# Patient Record
Sex: Female | Born: 1990 | Race: White | Hispanic: No | Marital: Single | Smoking: Former smoker
Health system: Southern US, Community
[De-identification: ages and names within clinical notes are randomized; demographics above are authoritative.]

## PROBLEM LIST (undated history)

## (undated) ENCOUNTER — Ambulatory Visit: Admission: EM | Payer: No Typology Code available for payment source

## (undated) ENCOUNTER — Ambulatory Visit: Admission: EM | Payer: Self-pay | Source: Home / Self Care

## (undated) ENCOUNTER — Ambulatory Visit: Admission: EM | Payer: No Typology Code available for payment source | Source: Home / Self Care

---
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
## 2010-03-24 ENCOUNTER — Emergency Department: Payer: Self-pay | Admitting: Emergency Medicine

## 2010-06-01 ENCOUNTER — Emergency Department: Payer: Self-pay | Admitting: Emergency Medicine

## 2011-10-01 ENCOUNTER — Emergency Department: Payer: Self-pay | Admitting: Emergency Medicine

## 2011-10-01 LAB — BASIC METABOLIC PANEL WITH GFR
Anion Gap: 4 — ABNORMAL LOW (ref 7–16)
BUN: 8 mg/dL (ref 7–18)
Calcium, Total: 9 mg/dL (ref 8.5–10.1)
Chloride: 108 mmol/L — ABNORMAL HIGH (ref 98–107)
Co2: 28 mmol/L (ref 21–32)
Creatinine: 0.69 mg/dL (ref 0.60–1.30)
EGFR (African American): >60
EGFR (Non-African Amer.): >60
Glucose: 95 mg/dL (ref 65–99)
Osmolality: 278 (ref 275–301)
Potassium: 3.5 mmol/L (ref 3.5–5.1)
Sodium: 140 mmol/L (ref 136–145)

## 2011-10-01 LAB — CBC
HCT: 41.7 % (ref 35.0–47.0)
HGB: 14.2 g/dL (ref 12.0–16.0)
MCH: 29.4 pg (ref 26.0–34.0)
MCHC: 34 g/dL (ref 32.0–36.0)
MCV: 87 fL (ref 80–100)
Platelet: 179 10*3/uL (ref 150–440)
RBC: 4.82 10*6/uL (ref 3.80–5.20)
RDW: 14.2 % (ref 11.5–14.5)
WBC: 14.9 10*3/uL — ABNORMAL HIGH (ref 3.6–11.0)

## 2011-10-01 LAB — MONONUCLEOSIS SCREEN: Mono Test: NEGATIVE

## 2011-10-03 LAB — BETA STREP CULTURE(ARMC)

## 2011-12-09 ENCOUNTER — Emergency Department: Payer: Self-pay | Admitting: Emergency Medicine

## 2012-08-13 ENCOUNTER — Emergency Department: Payer: Self-pay | Admitting: Emergency Medicine

## 2012-08-29 ENCOUNTER — Emergency Department: Payer: Self-pay | Admitting: Internal Medicine

## 2012-09-25 ENCOUNTER — Emergency Department: Payer: Self-pay | Admitting: Emergency Medicine

## 2012-09-25 LAB — GC/CHLAMYDIA PROBE AMP

## 2012-09-25 LAB — COMPREHENSIVE METABOLIC PANEL
Albumin: 3.7 g/dL (ref 3.4–5.0)
Alkaline Phosphatase: 75 U/L (ref 50–136)
Anion Gap: 4 — ABNORMAL LOW (ref 7–16)
BUN: 12 mg/dL (ref 7–18)
Bilirubin,Total: 0.3 mg/dL (ref 0.2–1.0)
Calcium, Total: 8.9 mg/dL (ref 8.5–10.1)
Chloride: 108 mmol/L — ABNORMAL HIGH (ref 98–107)
Co2: 26 mmol/L (ref 21–32)
Creatinine: 0.72 mg/dL (ref 0.60–1.30)
EGFR (African American): >60
EGFR (Non-African Amer.): >60
Glucose: 97 mg/dL (ref 65–99)
Osmolality: 275 (ref 275–301)
Potassium: 3.5 mmol/L (ref 3.5–5.1)
SGOT(AST): 19 U/L (ref 15–37)
SGPT (ALT): 20 U/L (ref 12–78)
Sodium: 138 mmol/L (ref 136–145)
Total Protein: 7.2 g/dL (ref 6.4–8.2)

## 2012-09-25 LAB — URINALYSIS, COMPLETE
Bilirubin,UR: NEGATIVE
Glucose,UR: NEGATIVE mg/dL (ref 0–75)
Nitrite: NEGATIVE
Ph: 5 (ref 4.5–8.0)
Protein: 30
RBC,UR: 4 /HPF (ref 0–5)
Specific Gravity: 1.027 (ref 1.003–1.030)
Squamous Epithelial: 11
WBC UR: 35 /HPF (ref 0–5)

## 2012-09-25 LAB — CBC
HCT: 36.9 % (ref 35.0–47.0)
HGB: 12.7 g/dL (ref 12.0–16.0)
MCH: 29.5 pg (ref 26.0–34.0)
MCHC: 34.4 g/dL (ref 32.0–36.0)
MCV: 86 fL (ref 80–100)
Platelet: 162 10*3/uL (ref 150–440)
RBC: 4.31 10*6/uL (ref 3.80–5.20)
RDW: 13.6 % (ref 11.5–14.5)
WBC: 6.1 10*3/uL (ref 3.6–11.0)

## 2012-09-25 LAB — LIPASE, BLOOD: Lipase: 120 U/L (ref 73–393)

## 2012-09-25 LAB — WET PREP, GENITAL

## 2012-09-25 LAB — HCG, QUANTITATIVE, PREGNANCY: Beta Hcg, Quant.: 11 m[IU]/mL — ABNORMAL HIGH

## 2012-09-27 LAB — URINE CULTURE

## 2013-09-16 DIAGNOSIS — B009 Herpesviral infection, unspecified: Secondary | ICD-10-CM | POA: Insufficient documentation

## 2014-03-02 DIAGNOSIS — E669 Obesity, unspecified: Secondary | ICD-10-CM | POA: Insufficient documentation

## 2014-12-16 IMAGING — US US OB < 14 WEEKS - US OB TV
1 series · 14 of 28 positions shown · non-contrast
Comparison: none

REASON FOR EXAM: abdominal pain, recent miscarriage
COMMENTS:

[Series 1: us ob < 14 weeks - us ob tv · 0.23mm/px · 14 of 92 slices shown]
[im 4/92]
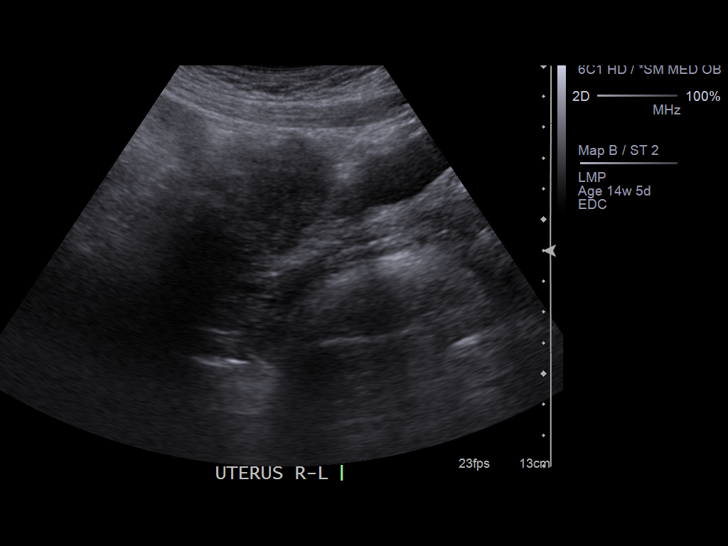
[im 11/92]
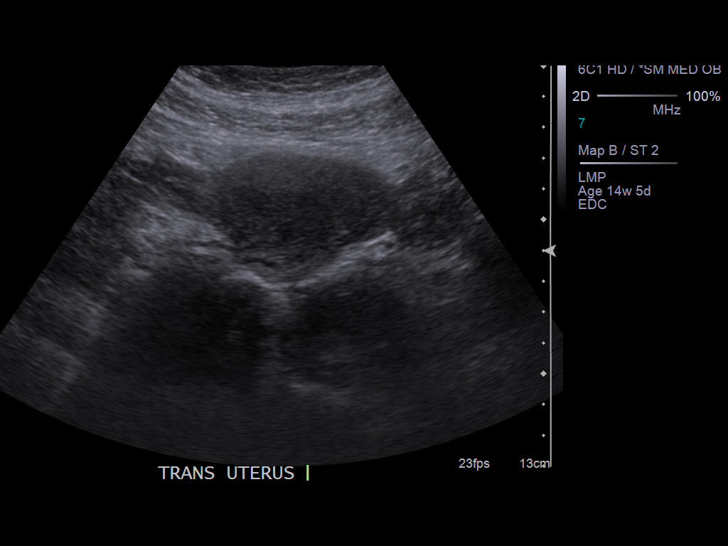
[im 17/92]
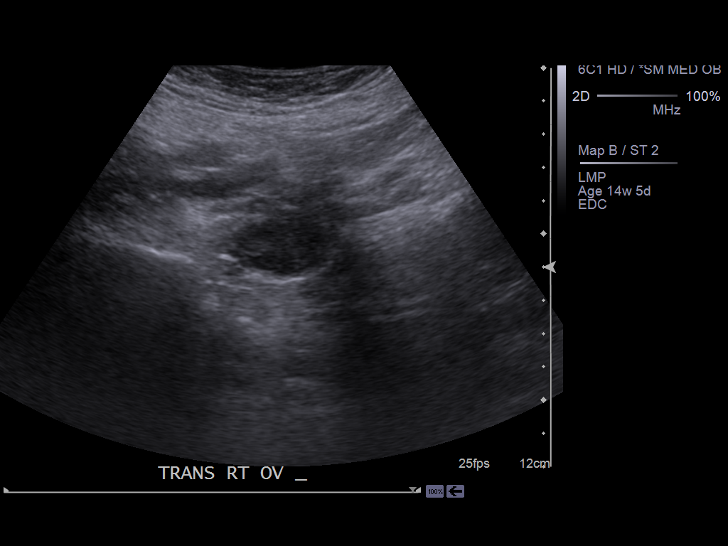
[im 24/92]
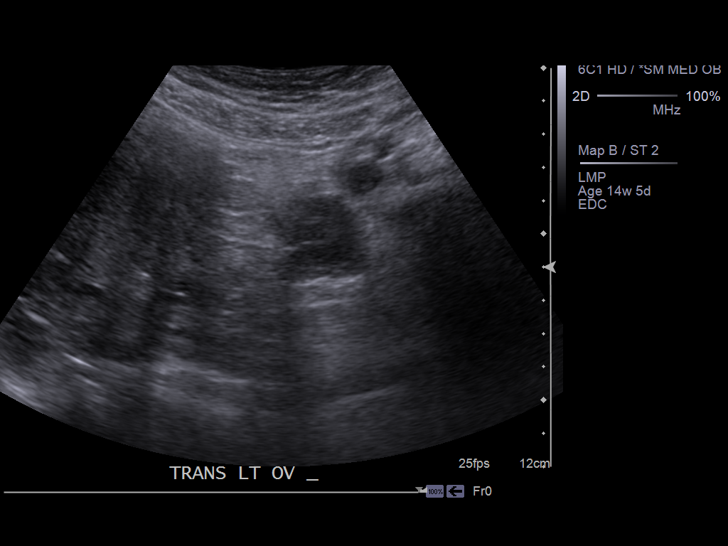
[im 31/92]
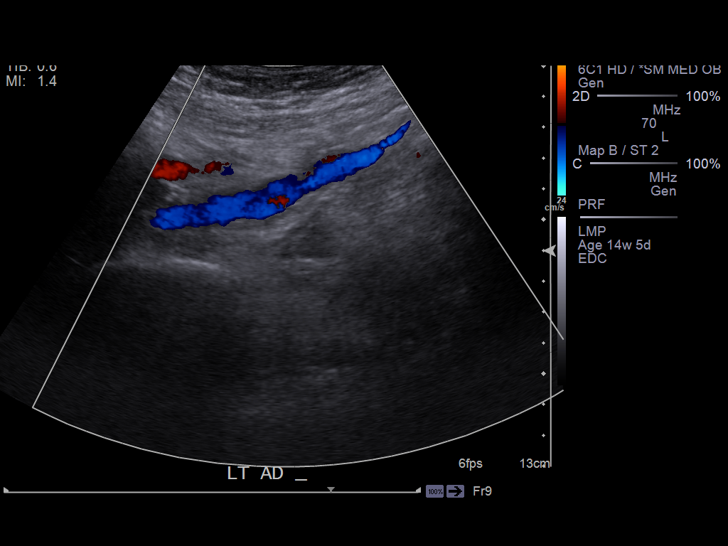
[im 38/92]
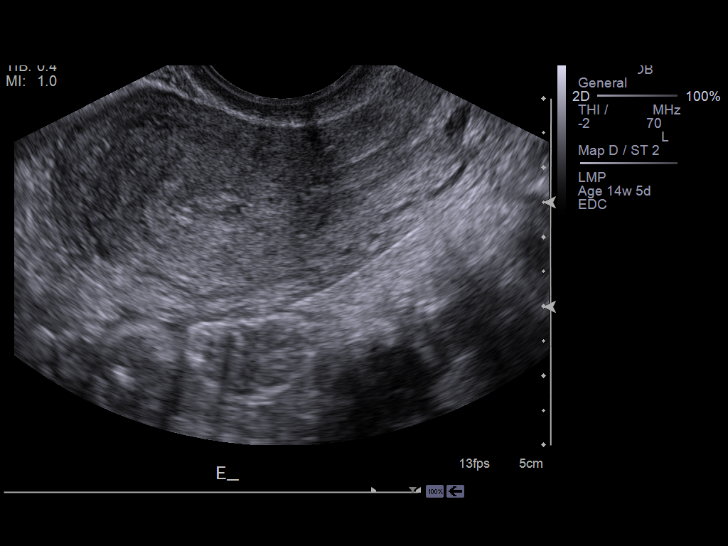
[im 44/92]
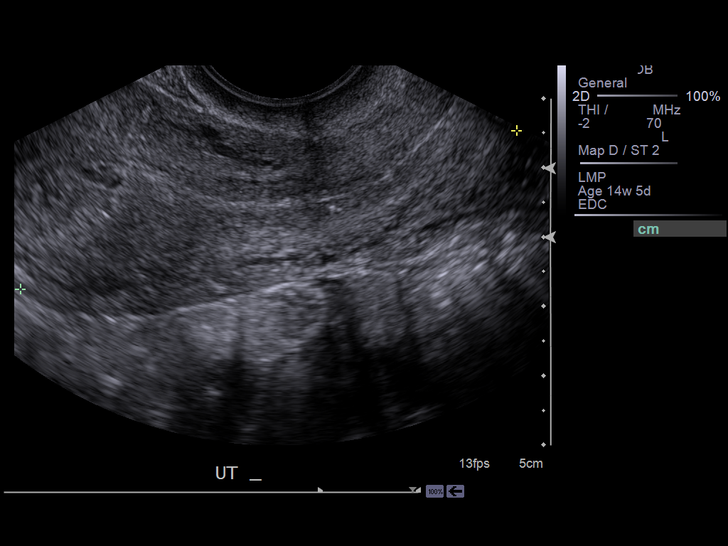
[im 51/92]
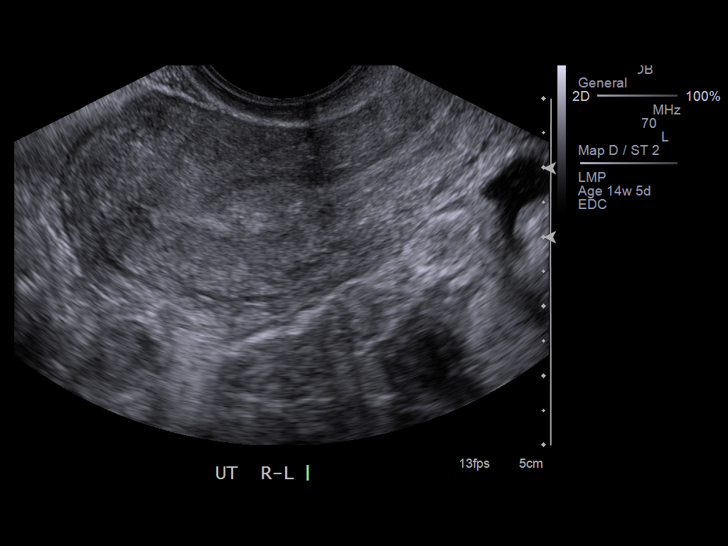
[im 58/92]
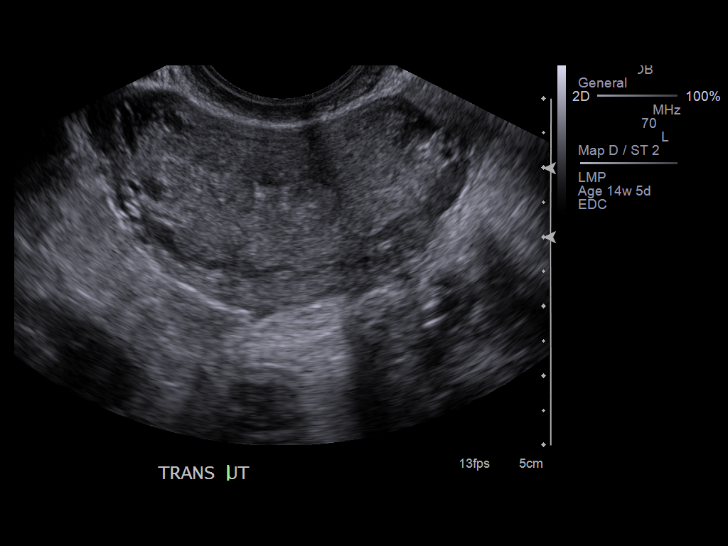
[im 65/92]
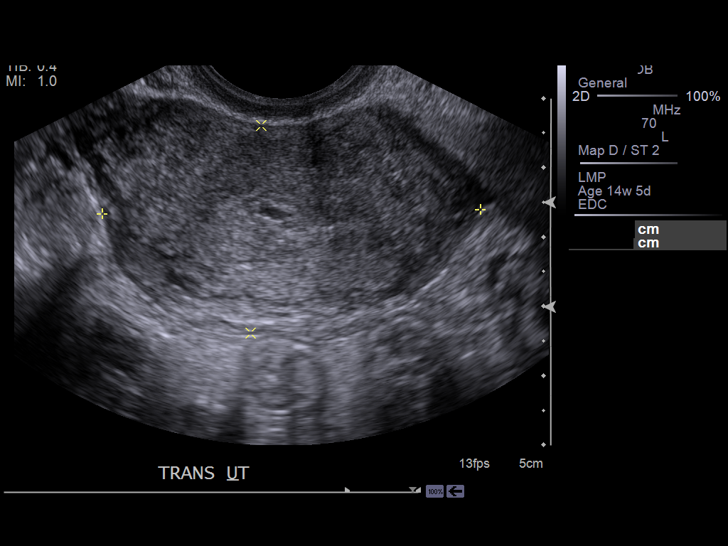
[im 71/92]
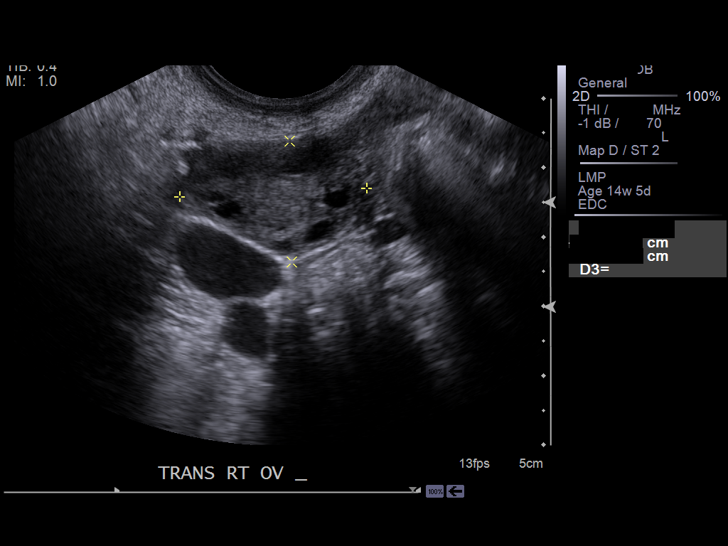
[im 78/92]
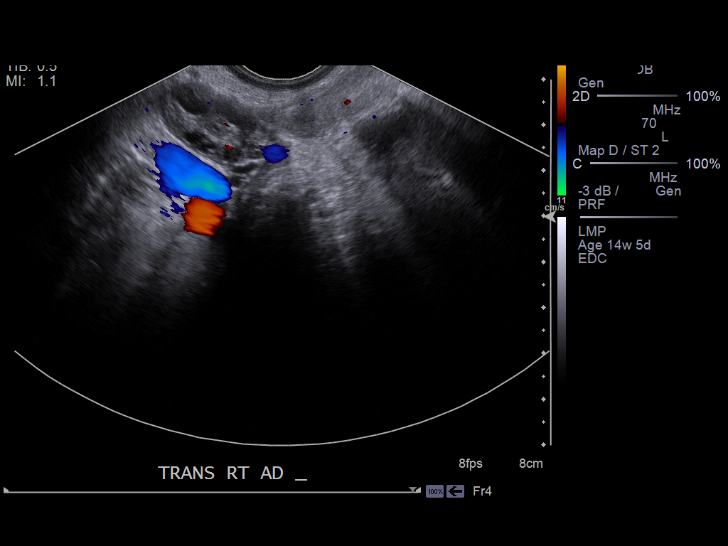
[im 85/92]
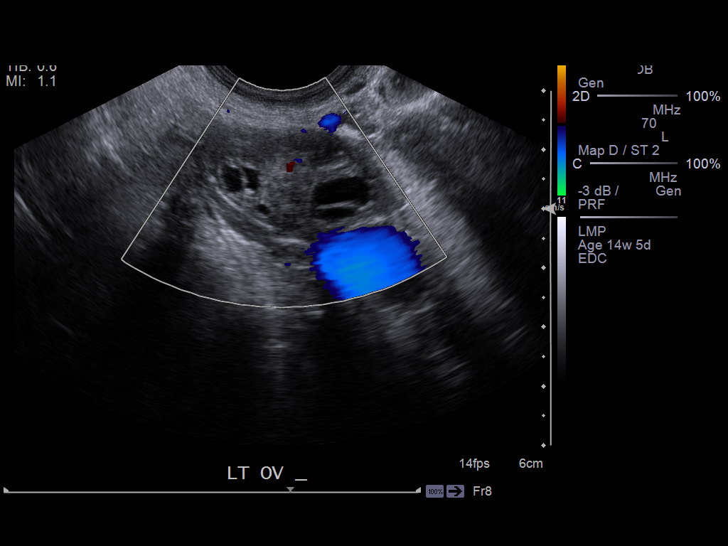
[im 92/92]
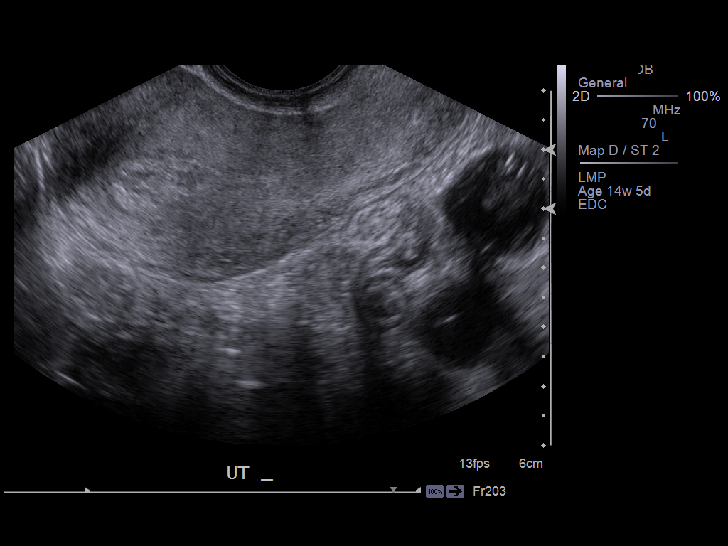

[14 of 28 positions shown; findings below may reference images not displayed]

PROCEDURE:     US  - US OB LESS THAN 14 WEEKS/W TRANS  - September 25, 2012 [DATE]

RESULT:     The patient reports recently sustaining a miscarriage.

The uterus appears empty. The endometrial stripe is thickened at 11.5 mm and
its echotexture is increased. It is also hypervascular. No discrete
gestational sac is demonstrated. There is a small amount of free fluid in
the adnexal regions. The right ovary is normal in echotexture and
vascularity and measures 2.7 x 1.7 x 3.4 cm. The left ovary measures 2.3 x
1.9 x 3.3 centimeters and exhibits normal vascularity.
IMPRESSION: 1. There is no evidence of an IUP nor of an ectopic pregnancy.
2. A small amount of free fluid is present in the adnexal regions.
3. The endometrium is thickened, hypervascular, an irregular which may be
indicative of earlier miscarriage. Correlation with patient's beta-hCG is
needed. Followup ultrasound is available upon reque[REDACTED]

## 2016-04-26 LAB — HM PAP SMEAR: HM Pap smear: NEGATIVE

## 2017-05-21 ENCOUNTER — Encounter: Payer: Self-pay | Admitting: Medical Oncology

## 2017-05-21 ENCOUNTER — Emergency Department
Admission: EM | Admit: 2017-05-21 | Discharge: 2017-05-21 | Disposition: A | Discharge status: Home / Self Care | Payer: Self-pay | Attending: Emergency Medicine | Admitting: Emergency Medicine

## 2017-05-21 DIAGNOSIS — K0889 Other specified disorders of teeth and supporting structures: Secondary | ICD-10-CM | POA: Insufficient documentation

## 2017-05-21 MED ORDER — KETOROLAC TROMETHAMINE 10 MG PO TABS: 10.0000 mg | ORAL_TABLET | Freq: Four times a day (QID) | ORAL | 0 refills | Status: AC | PRN

## 2017-05-21 MED ORDER — AMOXICILLIN 500 MG PO TABS: 500.0000 mg | ORAL_TABLET | Freq: Three times a day (TID) | ORAL | 0 refills | Status: AC

## 2017-05-21 MED ORDER — KETOROLAC TROMETHAMINE 30 MG/ML IJ SOLN
30.0000 mg | Freq: Once | INTRAMUSCULAR | Status: AC
  Administered 2017-05-21: 30 mg via INTRAMUSCULAR
  Filled 2017-05-21: qty 1

## 2017-05-21 MED ORDER — AMOXICILLIN 500 MG PO CAPS
500.0000 mg | ORAL_CAPSULE | Freq: Once | ORAL | Status: AC
  Administered 2017-05-21: 500 mg via ORAL
  Filled 2017-05-21: qty 1

## 2017-05-21 NOTE — Discharge Instructions (Signed)
OPTIONS FOR DENTAL FOLLOW UP CARE ° °[place] Department of Health and Human Services - Local Safety Net Dental Clinics °http://www.ncdhhs.gov/dph/oralhealth/services/safetynetclinics.htm °  °[facility] ([phone]) ° °[facility] ([phone]) ° °[facility] ([phone]) ° °[facility] ([phone]) ° °[facility] ([phone]) °This clinic caters to the indigent population and is on a lottery system. °Location: °[facility], [facility]arr[facility] Meister, [address], [place] °Clinic Hours: °Wednesdays from 6pm - 9pm, patients seen by a lottery system. °For dates, call or go to [email or web address] °Services: °Cleanings, fillings and simple extractions. °Payment Options: °DENTAL WORK IS FREE OF CHARGE. Bring proof of income or support. °Best way to get seen: °Arrive at 5:15 pm - this is a lottery, NOT first come/first serve, so arriving earlier will not increase your chances of being seen. °  °  °[facility] °[phone] °Select option 1 for emergencies °  °Location: °[facility], [facility] Moltz, [address], [place] °Clinic Hours: °No walk-ins accepted - call the day before to schedule an appointment. °Check in times are 9:30 am and 1:30 pm. °Services: °Simple extractions, temporary fillings, pulpectomy/pulp debridement, uncomplicated abscess drainage. °Payment Options: °PAYMENT IS DUE AT THE TIME OF SERVICE.  Fee is usually $100-200, additional surgical procedures (e.g. abscess drainage) may be extra. °Cash, checks, Visa/MasterCard accepted.  Can file Medicaid if patient is covered for dental - patient should call case worker to check. °No discount for [facility] patients. °Best way to get seen: °MUST call the day before and get onto the schedule. Can usually be seen the next 1-2 days. No walk-ins accepted. °  °  °[facility] °[phone] °   °Location: °[facility], [address], [place] °Clinic Hours: °M, W, Th, F 8am or 1:30pm, Tues 9a or 1:30 - first come/first served. °Services: °Simple extractions, temporary fillings, uncomplicated abscess drainage.  You do not need to be an [place] resident. °Payment Options: °PAYMENT IS DUE AT THE TIME OF SERVICE. °Dental insurance, otherwise sliding scale - bring proof of income or support. °Depending on income and treatment needed, cost is usually $50-200. °Best way to get seen: °Arrive early as it is first come/first served. °  °  °[facility] °[phone] °  °Location: °[address] °Clinic Hours: °Mon-Thu 8a-5p °Services: °Most basic dental services including extractions and fillings. °Payment Options: °PAYMENT IS DUE AT THE TIME OF SERVICE. °Sliding scale, up to 50% off - bring proof if income or support. °Medicaid with dental option accepted. °Best way to get seen: °Call to schedule an appointment, can usually be seen within 2 weeks OR they will try to see walk-ins - show up at 8a or 2p (you may have to wait). °  °  °[facility] °[phone] °[place] RESIDENTS ONLY °  °Location: °[facility], [address], [place], [place] [address] °Clinic Hours: By appointment only. °Monday - Thursday 8am-5pm, Friday 8am-12pm °Services: Cleanings, fillings, extractions. °Payment Options: °PAYMENT IS DUE AT THE TIME OF SERVICE. °Cash, Visa or MasterCard. Sliding scale - $30 minimum per service. °Best way to get seen: °Come in to office, complete packet and make an appointment - need proof of income °or support monies for each household member and proof of [place] residence. °Usually takes about a month to get in. °  °  °[facility] °[phone] °  °Location: °[address]   [place] °Clinic Hours: Walk-in Urgent Care Dental Services are offered Monday-Friday  mornings only. °The numbers of emergencies accepted daily is limited to the number of °providers available. °Maximum 15 - Mondays, Wednesdays & Thursdays °Maximum 10 - Tuesdays & Fridays °Services: °You do not need to be a [place] resident to be seen for a dental emergency. °Emergencies are defined as pain, swelling, abnormal bleeding, or dental trauma. Walkins will receive x-rays if needed. °NOTE: Dental cleaning is not an emergency. °Payment Options: °PAYMENT IS DUE AT THE TIME OF SERVICE. °Minimum co-pay is $40.00 for uninsured patients. °Minimum co-pay is $3.00 for Medicaid with dental coverage. °Dental Insurance is accepted and must be presented at time of visit. °Medicare does not cover dental. °Forms of payment: Cash, credit card, checks. °Best way to get seen: °If not previously registered with the clinic, walk-in dental registration begins at 7:15 am and is on a first come/first serve basis. °If previously registered with the clinic, call to make an appointment. °  °  °[facility] °[phone] °[place] RESIDENTS ONLY °  °Location: °[address], [place], [place] °Clinic Hours: °Mon-Thu 10a-2p °Services: Extractions only! °Payment Options: °FREE (donations accepted) - bring proof of income or support °Best way to get seen: °Call and schedule an appointment OR come at 8am on the 1st Monday of every month (except for holidays) when it is first come/first served. °  °  °[facility] °[phone] °  °Location: °[address], [place] °Clinic Hours: °Friday mornings °Services, Payment Options, Best way to get seen: °Call for info °

## 2017-05-21 NOTE — ED Notes (Signed)
Patient had no rxn @ injection site.

## 2017-05-21 NOTE — ED Triage Notes (Signed)
Pt reports rt sided dental pain x 2 days.

## 2017-05-21 NOTE — ED Provider Notes (Signed)
[facility] Emergency Department Provider Note  ____________________________________________  Time seen: Approximately 8:03 PM  I have reviewed the triage vital signs and the nursing notes.   HISTORY  Chief Complaint Dental Pain    HPI Sonya Perez is a 27 y.o. female presents to the emergency department with 8 out of 10 right-sided lower dental pain from inferior 32.  Patient is also noticed some edema of the right lower jaw.  Patient denies a history of dental abscesses.  She has not made an appointment with a local dentist due to a lack of insurance coverage.  She denies fever and chills.  She denies shortness of breath and dysphasia. Patient has been taking Ibuprofen.    History reviewed. No pertinent past medical history.  There are no active problems to display for this patient.   History reviewed. No pertinent surgical history.  Prior to Admission medications   Medication Sig Start Date End Date Taking? Authorizing Provider  amoxicillin (AMOXIL) 500 MG tablet Take 1 tablet (500 mg total) by mouth 3 (three) times daily for 10 days. 05/21/17 05/31/17  Orvil FeilWoods, Jaclyn M, PA-C  ketorolac (TORADOL) 10 MG tablet Take 1 tablet (10 mg total) by mouth every 6 (six) hours as needed for up to 5 days. 05/21/17 05/26/17  Orvil FeilWoods, Jaclyn M, PA-C    Allergies Patient has no known allergies.  No family history on file.  Social History Social History   Tobacco Use  . Smoking status: Not on file  Substance Use Topics  . Alcohol use: Not on file  . Drug use: Not on file     Review of Systems  Constitutional: No fever/chills Eyes: No visual changes. No discharge ENT: Patient has inferior 32 pain.  Cardiovascular: no chest pain. Respiratory: no cough. No SOB. Gastrointestinal: No abdominal pain.  No nausea, no vomiting.  No diarrhea.  No constipation. Genitourinary: Negative for dysuria. No hematuria Musculoskeletal: Negative for musculoskeletal pain. Skin:  Negative for rash, abrasions, lacerations, ecchymosis. Neurological: Negative for headaches, focal weakness or numbness.   ____________________________________________   PHYSICAL EXAM:  VITAL SIGNS: ED Triage Vitals  Enc Vitals Group     BP 05/21/17 1828 137/88     Pulse Rate 05/21/17 1828 69     Resp 05/21/17 1828 18     Temp 05/21/17 1828 98.3 F (36.8 C)     Temp Source 05/21/17 1828 Oral     SpO2 05/21/17 1828 100 %     Weight 05/21/17 1813 179 lb (81.2 kg)     Height 05/21/17 1813 5\' 4"  (1.626 m)     Head Circumference --      Peak Flow --      Pain Score 05/21/17 1813 10     Pain Loc --      Pain Edu? --      Excl. in GC? --      Constitutional: Alert and oriented. Well appearing and in no acute distress. Eyes: Conjunctivae are normal. PERRL. EOMI. Head: Atraumatic. ENT:      Ears: TMs are pearly.      Nose: No congestion/rhinnorhea.      Mouth/Throat: Mucous membranes are moist.  Patient has inferior 32 caries. Hematological/Lymphatic/Immunilogical: No cervical lymphadenopathy. Cardiovascular: Normal rate, regular rhythm. Normal S1 and S2.  Good peripheral circulation. Respiratory: Normal respiratory effort without tachypnea or retractions. Lungs CTAB. Good air entry to the bases with no decreased or absent breath sounds. Gastrointestinal: Bowel sounds 4 quadrants. Soft and nontender to  palpation. No guarding or rigidity. No palpable masses. No distention. No CVA tenderness. Musculoskeletal: Full range of motion to all extremities. No gross deformities appreciated. Neurologic:  Normal speech and language. No gross focal neurologic deficits are appreciated.  Skin:  Skin is warm, dry and intact. No rash noted. ____________________________________________   LABS (all labs ordered are listed, but only abnormal results are displayed)  Labs Reviewed - No data to  display ____________________________________________  EKG   ____________________________________________  RADIOLOGY  No results found.  ____________________________________________    PROCEDURES  Procedure(s) performed:    Procedures    Medications  ketorolac (TORADOL) 30 MG/ML injection 30 mg (30 mg Intramuscular Given 05/21/17 2002)  amoxicillin (AMOXIL) capsule 500 mg (500 mg Oral Given 05/21/17 2003)     ____________________________________________   INITIAL IMPRESSION / ASSESSMENT AND PLAN / ED COURSE  Pertinent labs & imaging results that were available during my care of the patient were reviewed by me and considered in my medical decision making (see chart for details).  Review of the [place] CSRS was performed in accordance of the [facility] prior to dispensing any controlled drugs.     Assessment and plan Dental pain Patient presents to the emergency department with dental pain from inferior 32.  Patient was discharged with amoxicillin.  She was given an injection of Toradol in the emergency department.  She was discharged with Toradol.  Vital signs are reassuring prior to discharge.  All patient questions were answered.   ____________________________________________  FINAL CLINICAL IMPRESSION(S) / ED DIAGNOSES  Final diagnoses:  Pain, dental      NEW MEDICATIONS STARTED DURING THIS VISIT:  ED Discharge Orders        Ordered    ketorolac (TORADOL) 10 MG tablet  Every 6 hours PRN     05/21/17 1939    amoxicillin (AMOXIL) 500 MG tablet  3 times daily     05/21/17 1942          This chart was dictated using voice recognition software/Dragon. Despite best efforts to proofread, errors can occur which can change the meaning. Any change was purely unintentional.    Gasper Lloyd 05/21/17 2009    Don Perking, [place], MD 05/22/17 [phone]

## 2017-10-18 ENCOUNTER — Encounter: Payer: Self-pay | Admitting: Emergency Medicine

## 2017-10-18 ENCOUNTER — Emergency Department
Admission: EM | Admit: 2017-10-18 | Discharge: 2017-10-18 | Disposition: A | Discharge status: Home / Self Care | Payer: Self-pay | Attending: Student in an Organized Health Care Education/Training Program | Admitting: Student in an Organized Health Care Education/Training Program

## 2017-10-18 DIAGNOSIS — K047 Periapical abscess without sinus: Secondary | ICD-10-CM

## 2017-10-18 MED ORDER — AMOXICILLIN 500 MG PO TABS: 500.0000 mg | ORAL_TABLET | Freq: Three times a day (TID) | ORAL | 0 refills | Status: DC

## 2017-10-18 MED ORDER — AMOXICILLIN 500 MG PO CAPS
500.0000 mg | ORAL_CAPSULE | Freq: Once | ORAL | Status: AC
  Administered 2017-10-18: 500 mg via ORAL
  Filled 2017-10-18: qty 1

## 2017-10-18 NOTE — ED Triage Notes (Signed)
Pt reports thinks she is having trouble with her right lower wisdom teeth for the past 2 days.

## 2017-10-18 NOTE — ED Notes (Signed)
See triage note  States she developed swelling and pain to right lower gum line

## 2017-10-18 NOTE — Discharge Instructions (Signed)
Please call and schedule a dental appointment as soon as possible. You will need to be seen within the next 14 days. Return to the emergency department for symptoms that change or worsen if you're unable to schedule an appointment.  OPTIONS FOR DENTAL FOLLOW UP CARE  [place] Department of Health and Human Services - Local Safety Net Dental Clinics http://www.ncdhhs.gov/dph/oralhealth/services/safetynetclinics.htm   [facility] ([phone])  [facility] ([phone])  [facility] ([phone])  [facility] ([phone])  [facility] ([phone]) This clinic caters to the indigent population and is on a lottery system. Location: [facility], [address], [place] Clinic Hours: Wednesdays from 6pm - 9pm, patients seen by a lottery system. For dates, call or go to [email or web address] Services: Cleanings, fillings and simple extractions. Payment Options: DENTAL WORK IS FREE OF CHARGE. Bring proof of income or support. Best way to get seen: Arrive at 5:15 pm - this is a lottery, NOT first come/first serve, so arriving earlier will not increase your chances of being seen.     [facility] [phone] Select option 1 for emergencies   Location: [facility] Dunnigan, [address], [place] Clinic Hours: No walk-ins accepted - call the day before to schedule an appointment. Check in times are 9:30 am and 1:30 pm. Services: Simple extractions, temporary fillings, pulpectomy/pulp debridement, uncomplicated abscess drainage. Payment Options: PAYMENT IS DUE AT THE TIME OF SERVICE.  Fee is usually $100-200, additional surgical procedures (e.g. abscess drainage) may be extra. Cash, checks, Visa/MasterCard accepted.  Can file Medicaid if patient is covered for dental - patient should call case worker to check. No  discount for [facility] patients. Best way to get seen: MUST call the day before and get onto the schedule. Can usually be seen the next 1-2 days. No walk-ins accepted.     [facility] [phone]   Location: [facility], [address], [place] Clinic Hours: M, W, Th, F 8am or 1:30pm, Tues 9a or 1:30 - first come/first served. Services: Simple extractions, temporary fillings, uncomplicated abscess drainage.  You do not need to be an [place] resident. Payment Options: PAYMENT IS DUE AT THE TIME OF SERVICE. Dental insurance, otherwise sliding scale - bring proof of income or support. Depending on income and treatment needed, cost is usually $50-200. Best way to get seen: Arrive early as it is first come/first served.     [facility] [phone]   Location: [address] Clinic Hours: Mon-Thu 8a-5p Services: Most basic dental services including extractions and fillings. Payment Options: PAYMENT IS DUE AT THE TIME OF SERVICE. Sliding scale, up to 50% off - bring proof if income or support. Medicaid with dental option accepted. Best way to get seen: Call to schedule an appointment, can usually be seen within 2 weeks OR they will try to see walk-ins - show up at 8a or 2p (you may have to wait).     [facility] [phone] [place] ONLY   Location: [facility], [address], [place], [place] [address] Clinic Hours: By appointment only. Monday - Thursday 8am-5pm, Friday 8am-12pm Services: Cleanings, fillings, extractions. Payment Options: PAYMENT IS DUE AT THE TIME OF SERVICE. Cash, Visa or MasterCard. Sliding scale - $30 minimum per service. Best way to get seen: Come in to office, complete packet and make an appointment -   need proof of income or support monies for each household member and proof of [place]  residence. Usually takes about a month to get in.     [facility] [phone]   Location: [address] [place] Clinic Hours: Walk-in Urgent Care Dental Services are offered Monday-Friday mornings only. The numbers of emergencies accepted daily is limited to the number of providers available. Maximum 15 - Mondays, Wednesdays & Thursdays Maximum 10 - Tuesdays & Fridays Services: You do not need to be a [place] resident to be seen for a dental emergency. Emergencies are defined as pain, swelling, abnormal bleeding, or dental trauma. Walkins will receive x-rays if needed. NOTE: Dental cleaning is not an emergency. Payment Options: PAYMENT IS DUE AT THE TIME OF SERVICE. Minimum co-pay is $40.00 for uninsured patients. Minimum co-pay is $3.00 for Medicaid with dental coverage. Dental Insurance is accepted and must be presented at time of visit. Medicare does not cover dental. Forms of payment: Cash, credit card, checks. Best way to get seen: If not previously registered with the clinic, walk-in dental registration begins at 7:15 am and is on a first come/first serve basis. If previously registered with the clinic, call to make an appointment.     [facility] [phone] [place] RESIDENTS ONLY   Location: [address], [place], [place] Clinic Hours: Mon-Thu 10a-2p Services: Extractions only! Payment Options: FREE (donations accepted) - bring proof of income or support Best way to get seen: Call and schedule an appointment OR come at 8am on the 1st Monday of every month (except for holidays) when it is first come/first served.     [facility] [phone]   Location: [address], [place] Clinic Hours: Friday mornings Services, Payment Options, Best way to get seen: Call for info  

## 2017-10-18 NOTE — ED Provider Notes (Signed)
[facility] Emergency Department Provider Note ____________________________________________  Time seen: Approximately 8:48 AM  I have reviewed the triage vital signs and the nursing notes.   HISTORY  Chief Complaint Dental Pain   HPI Sonya Perez is a 27 y.o. female who presents to the emergency department for treatment and evaluation of dental pain.  She states that she believes her wisdom tooth is broken and is now consequently infected.  She has had some swelling in the right lower jaw for the past couple of days.  She is taken Tylenol and ibuprofen over-the-counter which has helped some but she knows that she needs some antibiotics.  She does not currently have a dentist but plans to contact someone today.   History reviewed. No pertinent past medical history.  There are no active problems to display for this patient.   History reviewed. No pertinent surgical history.  Prior to Admission medications   Medication Sig Start Date End Date Taking? Authorizing Provider  amoxicillin (AMOXIL) 500 MG tablet Take 1 tablet (500 mg total) by mouth 3 (three) times daily. 10/18/17   Chinita Pester, FNP    Allergies Patient has no known allergies.  No family history on file.  Social History Social History   Tobacco Use  . Smoking status: Not on file  Substance Use Topics  . Alcohol use: Not on file  . Drug use: Not on file    Review of Systems Constitutional: Negative for fever or recent illness. ENT: Positive for dental pain. Musculoskeletal: Negative for trismus of the jaw.  Skin: Negative for wound or lesion. ____________________________________________   PHYSICAL EXAM:  VITAL SIGNS: ED Triage Vitals [10/18/17 0825]  Enc Vitals Group     BP 126/80     Pulse Rate (!) 105     Resp 20     Temp 98.8 F (37.1 C)     Temp Source Oral     SpO2 97 %     Weight 182 lb (82.6 kg)     Height 5\' 4"  (1.626 m)     Head Circumference      Peak Flow       Pain Score 0     Pain Loc      Pain Edu?      Excl. in GC?     Constitutional: Alert and oriented. Well appearing and in no acute distress. Eyes: Conjunctiva are clear without discharge or drainage. Mouth/Throat: Patent Periodontal Exam    Hematological/Lymphatic/Immunilogical: No palpable anterior cervical lymphadenopathy. Respiratory: Respirations even and unlabored. Musculoskeletal: Full ROM of the jaw. Neurologic: Awake, alert, oriented.  Skin: Lower right side facial mild swelling noted Psychiatric: Affect and behavior intact.  ____________________________________________   LABS (all labs ordered are listed, but only abnormal results are displayed)  Labs Reviewed - No data to display ____________________________________________   RADIOLOGY  Not indicated. ____________________________________________   PROCEDURES  Procedure(s) performed:   Procedures  Critical Care performed: No ____________________________________________   INITIAL IMPRESSION / ASSESSMENT AND PLAN / ED COURSE  Sonya Perez is a 27 y.o. female who presents the emergency department for symptoms and evaluation of dental pain.  She will be placed on amoxicillin and will continue to take her Tylenol or ibuprofen.  She is to call and schedule an appointment with a dentist within the next 2 weeks.  She was encouraged to return to the emergency department for symptoms of change or worsen if she is unable to schedule appointment.  Pertinent labs &  imaging results that were available during my care of the patient were reviewed by me and considered in my medical decision making (see chart for details).  ____________________________________________   FINAL CLINICAL IMPRESSION(S) / ED DIAGNOSES  Final diagnoses:  Dental abscess    New Prescriptions   AMOXICILLIN (AMOXIL) 500 MG TABLET    Take 1 tablet (500 mg total) by mouth 3 (three) times daily.    If controlled substance prescribed  during this visit, 12 month history viewed on the [place] prior to issuing an initial prescription for Schedule II or III opiod.  Note:  This document was prepared using Dragon voice recognition software and may include unintentional dictation errors.    Chinita Pesterriplett, Melburn Treiber B, FNP 10/18/17 [id number]    Willy Eddyobinson, [place], MD 10/18/17 [phone]

## 2017-12-11 DIAGNOSIS — F419 Anxiety disorder, unspecified: Secondary | ICD-10-CM | POA: Insufficient documentation

## 2017-12-11 LAB — HM HIV SCREENING LAB: HM HIV Screening: NEGATIVE

## 2018-09-02 DIAGNOSIS — Z6830 Body mass index (BMI) 30.0-30.9, adult: Secondary | ICD-10-CM

## 2018-09-02 DIAGNOSIS — F419 Anxiety disorder, unspecified: Secondary | ICD-10-CM

## 2018-09-02 DIAGNOSIS — B009 Herpesviral infection, unspecified: Secondary | ICD-10-CM

## 2018-09-02 DIAGNOSIS — E669 Obesity, unspecified: Secondary | ICD-10-CM

## 2018-09-03 ENCOUNTER — Other Ambulatory Visit: Payer: Self-pay

## 2018-09-03 ENCOUNTER — Ambulatory Visit (LOCAL_COMMUNITY_HEALTH_CENTER): Payer: Self-pay

## 2018-09-03 DIAGNOSIS — Z202 Contact with and (suspected) exposure to infections with a predominantly sexual mode of transmission: Secondary | ICD-10-CM

## 2018-09-03 DIAGNOSIS — Z113 Encounter for screening for infections with a predominantly sexual mode of transmission: Secondary | ICD-10-CM

## 2018-09-03 LAB — WET PREP FOR TRICH, YEAST, CLUE
Trichomonas Exam: NEGATIVE
Yeast Exam: NEGATIVE

## 2018-09-03 MED ORDER — AZITHROMYCIN 500 MG PO TABS
1000.0000 mg | ORAL_TABLET | Freq: Once | ORAL | Status: AC
  Administered 2018-09-03: 1000 mg via ORAL

## 2018-09-03 NOTE — Progress Notes (Signed)
Here today for STD screening and a contact to Chlamydia. Declines bloodwork. Hal Morales, RN  Wet mount results reviewed. No treatment indicated per standing orders. Patient treated as a contact to Chlamydia per standing orders. Hal Morales, RN

## 2018-09-03 NOTE — Progress Notes (Signed)
    STI clinic/screening visit  Subjective:  Sonya Perez is a 28 y.o. female being seen today for an STI screening visit. The patient reports they do have symptoms.  Patient has the following medical conditions:   Patient Active Problem List   Diagnosis Date Noted  . Anxiety 12/11/2017  . Obesity, unspecified 03/02/2014  . 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 Exposure to STD    Contact to Chlamydia    HPI  Patient reports she is contact to chlamydia.  She has has small amt of yellow disch for 3 days. NO other sympts.  See flowsheet for further details and programmatic requirements.    The following portions of the patient's history were reviewed and updated as appropriate: allergies, current medications, past medical history, past social history, past surgical history and problem list.  Objective:  There were no vitals filed for this visit.  Physical Exam HENT:     Mouth/Throat:     Mouth: Mucous membranes are moist.     Pharynx: Oropharynx is clear.  Neck:     Musculoskeletal: Neck supple. No muscular tenderness.  Abdominal:     Palpations: Abdomen is soft.     Tenderness: There is no abdominal tenderness.  Genitourinary:    General: Normal vulva.     Vagina: Vaginal discharge present.     Comments: Thin. Yellow disch. No odor, ph >4.5 No adenopathy Lymphadenopathy:     Cervical: No cervical adenopathy.  Skin:    General: Skin is warm and dry.     Findings: No lesion or rash.  Neurological:     Mental Status: She is alert.    Assessment and Plan:  Sonya Perez is a 28 y.o. female presenting to the [facility] Department for STI screening  1. Screening examination for venereal disease - WET PREP FOR [place], YEAST, CLUE - Chlamydia/Gonorrhea [place] [facility]  2. Chlamydia contact - azithromycin (ZITHROMAX) tablet 1,000 mg Co. No sexual activity x 1 wk. Condoms always    No follow-ups on file.  No  future appointments.  Hassell Done, FNP

## 2018-09-15 ENCOUNTER — Ambulatory Visit
Admission: EM | Admit: 2018-09-15 | Discharge: 2018-09-15 | Disposition: A | Discharge status: Home / Self Care | Payer: Self-pay | Attending: Family Medicine | Admitting: Family Medicine

## 2018-09-15 ENCOUNTER — Other Ambulatory Visit: Payer: Self-pay

## 2018-09-15 DIAGNOSIS — T148XXA Other injury of unspecified body region, initial encounter: Secondary | ICD-10-CM

## 2018-09-15 DIAGNOSIS — Z23 Encounter for immunization: Secondary | ICD-10-CM

## 2018-09-15 MED ORDER — TETANUS-DIPHTH-ACELL PERTUSSIS 5-2.5-18.5 LF-MCG/0.5 IM SUSP
0.5000 mL | Freq: Once | INTRAMUSCULAR | Status: AC
  Administered 2018-09-15: 11:00:00 0.5 mL via INTRAMUSCULAR

## 2018-09-15 MED ORDER — CEPHALEXIN 500 MG PO CAPS: 500.0000 mg | ORAL_CAPSULE | Freq: Four times a day (QID) | ORAL | 0 refills | Status: AC
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
## 2018-09-15 NOTE — Discharge Instructions (Signed)
Antibiotics as prescribed.  Take care  Dr. Jalysa Swopes  

## 2018-10-01 ENCOUNTER — Telehealth: Payer: Self-pay | Admitting: Family Medicine

## 2018-10-01 NOTE — Telephone Encounter (Signed)
patient wants to speak to nurse. She is also having trouble with her mychart.

## 2018-10-02 NOTE — Telephone Encounter (Signed)
Pt knew password from last visit. Pt given test results for GC and Chlamydia.

## 2018-11-12 ENCOUNTER — Telehealth: Payer: Self-pay | Admitting: Family Medicine

## 2018-11-12 NOTE — Telephone Encounter (Signed)
pls call me I have questions on my lab results

## 2018-11-12 NOTE — Telephone Encounter (Signed)
TC with patient.  Verified ID via password. Informed GC/Chlamydia results negative.  Discussed access to [facility]. Aileen Fass, RN

## 2018-11-12 NOTE — Telephone Encounter (Signed)
TC with patient--

## 2018-11-17 ENCOUNTER — Encounter: Payer: Self-pay | Admitting: Physician Assistant

## 2018-11-17 ENCOUNTER — Ambulatory Visit: Payer: Self-pay | Admitting: Physician Assistant

## 2018-11-17 ENCOUNTER — Other Ambulatory Visit: Payer: Self-pay

## 2018-11-17 DIAGNOSIS — Z113 Encounter for screening for infections with a predominantly sexual mode of transmission: Secondary | ICD-10-CM

## 2018-11-17 LAB — WET PREP FOR TRICH, YEAST, CLUE
Trichomonas Exam: NEGATIVE
Yeast Exam: NEGATIVE

## 2018-11-17 NOTE — Progress Notes (Signed)
    STI clinic/screening visit  Subjective:  Sonya Perez is a 28 y.o. female being seen today for an STI screening visit. The patient reports they do not have symptoms.  Patient has the following medical conditions:   Patient Active Problem List   Diagnosis Date Noted  . Anxiety 12/11/2017  . Obesity, unspecified 03/02/2014  . 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 SEXUALLY TRANSMITTED DISEASE    HPI  Patient reports that she is not having any symptoms.  States that she has "heard" that new partner has had Chlamydia and she wants to be checked to be safe.  See flowsheet for further details and programmatic requirements.    The following portions of the patient's history were reviewed and updated as appropriate: allergies, current medications, past medical history, past social history, past surgical history and problem list.  Objective:  There were no vitals filed for this visit.  Physical Exam Constitutional:      General: She is not in acute distress.    Appearance: Normal appearance.  HENT:     Head: Normocephalic and atraumatic.     Mouth/Throat:     Mouth: Mucous membranes are moist.     Pharynx: Oropharynx is clear. No oropharyngeal exudate or posterior oropharyngeal erythema.  Eyes:     Conjunctiva/sclera: Conjunctivae normal.  Neck:     Musculoskeletal: Neck supple.  Pulmonary:     Effort: Pulmonary effort is normal.  Abdominal:     Palpations: Abdomen is soft. There is no mass.     Tenderness: There is no abdominal tenderness. There is no guarding or rebound.  Genitourinary:    General: Normal vulva.     Rectum: Normal.     Comments: External genitalia/pubic area without nits, lice, edema, erythema, edema, lesions and inguinal adenopathy. Vagina with normal mucosa and discharge. Cervix without visible lesions. Uterus firm, mobile, nt, no masses, no CMT, no adnexal tenderness or fullness. Lymphadenopathy:   Cervical: No cervical adenopathy.  Skin:    General: Skin is warm and dry.     Findings: No bruising, erythema, lesion or rash.  Neurological:     Mental Status: She is alert and oriented to person, place, and time.  Psychiatric:        Mood and Affect: Mood normal.        Behavior: Behavior normal.        Thought Content: Thought content normal.        Judgment: Judgment normal.       Assessment and Plan:  CLARINDA OBI is a 28 y.o. female presenting to the [facility] Department for STI screening  1. Screening for STD (sexually transmitted disease) Patient is without symptoms today.  Declines blood work today. Rec condoms with all sex. Await test results.  Counseled that RN will call if needs to RTC for any treatment once results are back.  - WET PREP FOR [place], YEAST, [place] [facility]     No follow-ups on file.  No future appointments.  Jerene Dilling, PA

## 2018-11-17 NOTE — Progress Notes (Signed)
Wet mount reviewed. No tx per SO Tanyiah Laurich, RN  

## 2018-11-21 ENCOUNTER — Encounter: Payer: Self-pay | Admitting: Physician Assistant

## 2019-02-03 ENCOUNTER — Telehealth: Payer: Self-pay | Admitting: General Practice

## 2019-02-09 ENCOUNTER — Ambulatory Visit: Payer: Self-pay

## 2019-02-13 ENCOUNTER — Ambulatory Visit (LOCAL_COMMUNITY_HEALTH_CENTER): Payer: Self-pay

## 2019-02-13 ENCOUNTER — Other Ambulatory Visit: Payer: Self-pay

## 2019-02-13 VITALS — BP 128/89 | Ht 64.0 in | Wt 162.0 lb

## 2019-02-13 DIAGNOSIS — Z3041 Encounter for surveillance of contraceptive pills: Secondary | ICD-10-CM

## 2019-02-13 DIAGNOSIS — Z3009 Encounter for other general counseling and advice on contraception: Secondary | ICD-10-CM

## 2019-02-13 MED ORDER — NORGESTIM-ETH ESTRAD TRIPHASIC 0.18/0.215/0.25 MG-35 MCG PO TABS: 1.0000 | ORAL_TABLET | Freq: Every day | ORAL | 11 refills | Status: DC

## 2019-02-13 NOTE — Progress Notes (Addendum)
Last physical at [facility] 04/24/2018; #13 packs ordered by Jerilee Hoh, FNP; #11 packs dispensed due to expire date/qty available. Pt reports no problems with pills and desires OCP supply. Consulted with provider regarding pt BP and timeframe when RP due vs pt OCP supply. Per verbal order by Sadie Haber, PA, dispensed #3 packs of Tri Sprintec. Pt to call when she opens last pack for provider visit/physical.

## 2019-05-05 ENCOUNTER — Encounter: Payer: Self-pay | Admitting: Family Medicine

## 2019-05-05 ENCOUNTER — Other Ambulatory Visit: Payer: Self-pay

## 2019-05-05 ENCOUNTER — Ambulatory Visit: Payer: Self-pay | Admitting: Family Medicine

## 2019-05-05 VITALS — BP 118/79 | Ht 64.0 in | Wt 168.4 lb

## 2019-05-05 DIAGNOSIS — Z113 Encounter for screening for infections with a predominantly sexual mode of transmission: Secondary | ICD-10-CM

## 2019-05-05 DIAGNOSIS — Z3009 Encounter for other general counseling and advice on contraception: Secondary | ICD-10-CM

## 2019-05-05 DIAGNOSIS — Z8669 Personal history of other diseases of the nervous system and sense organs: Secondary | ICD-10-CM | POA: Insufficient documentation

## 2019-05-05 DIAGNOSIS — R87612 Low grade squamous intraepithelial lesion on cytologic smear of cervix (LGSIL): Secondary | ICD-10-CM

## 2019-05-05 LAB — WET PREP FOR TRICH, YEAST, CLUE
Trichomonas Exam: NEGATIVE
Yeast Exam: NEGATIVE

## 2019-05-05 MED ORDER — MULTIVITAMINS PO CAPS: 1.0000 | ORAL_CAPSULE | Freq: Every day | ORAL | 0 refills | Status: DC

## 2019-05-05 MED ORDER — NORGESTIM-ETH ESTRAD TRIPHASIC 0.18/0.215/0.25 MG-35 MCG PO TABS: 1.0000 | ORAL_TABLET | Freq: Every day | ORAL | 0 refills | Status: DC

## 2019-05-05 NOTE — Progress Notes (Addendum)
Here today for STD screening and birth control pill refill. Last PE was 04/24/18, last Pap Smear was 04/30/2016. Tawny Hopping, RN

## 2019-05-05 NOTE — Progress Notes (Signed)
Wet [place] results reviewed. Per standing orders no treatment indicated. Kennis Wissmann, RN  

## 2019-05-05 NOTE — Progress Notes (Signed)
Family Planning Visit  Subjective:  Sonya Perez is a 29 y.o. being seen today for  Chief Complaint  Patient presents with  . Contraception  . SEXUALLY TRANSMITTED DISEASE    Pt has Anxiety; Obesity, unspecified; HSV-2 infection; and History of migraine on their problem list.  HPI  Patient reports she is here for pap, has hx of abnormals (see below). Takes tri-sprintec OCP, would like refills, has not yet run out. Also would like STI testing, denies symptoms.   Pt does not meet any of the following contraindications to estrogen use: -Age ?35 years and smoking ?15 cigarettes per day -Migraine with aura -Two or more RF for arterial CVD (such as older age, smoking, diabetes, and hypertension) -HTN -Breast cancer -VTE hx or acute event -Known thrombogenic mutations -Known ischemic heart disease -History of stroke -Complicated valvular heart disease (pulmonary HTN, risk for afib, hx subacute bacterial endocarditis) -Cirrhosis, Hepatocellular adenoma or malignant hepatoma    Patient's last menstrual period was 04/20/2019 (exact date). BCM: OCP Pt desires EC? n/a  Last pap: 04/26/2016 = negative. 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  Patient reports 2 partner(s) in last year. Do they desire STI screening (if no, why not)? Yes, declines bloodwork  Does the patient desire a pregnancy in the next year? no   29 y.o., Body mass index is 28.91 kg/m. - Is patient eligible for HA1C diabetes screening based on BMI and age >72?  no  Does the patient have a current or past history of drug use? no No components found for: HCV  See flowsheet for other program required questions.   Health Maintenance Due  Topic Date Due  . PAP-Cervical Cytology Screening  04/27/2019  . PAP SMEAR-Modifier  04/27/2019    ROS  The following portions of the patient's history were reviewed and updated as appropriate: allergies, current medications, past family history, past medical history,  past social history, past surgical history and problem list. Problem list updated.  Objective:  BP 118/79   Ht 5\' 4"  (1.626 m)   Wt 168 lb 6.4 oz (76.4 kg)   LMP 04/20/2019 (Exact Date)   BMI 28.91 kg/m    Physical Exam Vitals and nursing note reviewed.  Constitutional:      Appearance: Normal appearance.  HENT:     Head: Normocephalic and atraumatic.     Mouth/Throat:     Mouth: Mucous membranes are moist.     Pharynx: Oropharynx is clear. No oropharyngeal exudate or posterior oropharyngeal erythema.  Pulmonary:     Effort: Pulmonary effort is normal.  Chest:     Breasts:        Right: Normal. No swelling, bleeding, inverted nipple, mass, nipple discharge, skin change or tenderness.        Left: Normal. No swelling, bleeding, inverted nipple, mass, nipple discharge, skin change or tenderness.  Abdominal:     General: Abdomen is flat.     Palpations: There is no mass.     Tenderness: There is no abdominal tenderness. There is no rebound.  Genitourinary:    General: Normal vulva.     Exam position: Lithotomy position.     Pubic Area: No rash or pubic lice.      Labia:        Right: No rash or lesion.        Left: No rash or lesion.      Vagina: Vaginal discharge (scant, white, ph<4.5) present. No erythema, bleeding or lesions.  Cervix: No cervical motion tenderness, discharge, friability, lesion or erythema.     Uterus: Normal.      Adnexa: Right adnexa normal and left adnexa normal.     Rectum: Normal.  Lymphadenopathy:     Head:     Right side of head: No preauricular or posterior auricular adenopathy.     Left side of head: No preauricular or posterior auricular adenopathy.     Cervical: No cervical adenopathy.     Upper Body:     Right upper body: No supraclavicular or axillary adenopathy.     Left upper body: No supraclavicular or axillary adenopathy.     Lower Body: No right inguinal adenopathy. No left inguinal adenopathy.  Skin:    General: Skin is warm  and dry.     Findings: No rash.  Neurological:     Mental Status: She is alert and oriented to person, place, and time.        Assessment and Plan:  Sonya Perez is a 29 y.o. female presenting to the [facility] Department for a well woman exam/family planning visit  Contraception counseling: Reviewed all forms of birth control options in the tiered based approach including abstinence; over the counter/barrier methods; hormonal contraceptive medication including pill, patch, ring, injection, contraceptive implant; hormonal and nonhormonal IUDs; permanent sterilization options including vasectomy and the various tubal sterilization modalities. Risks, benefits, how to discontinue and typical effectiveness rates were reviewed.  Questions were answered.  Written information was also given to the patient to review.  Patient desires COC, this was prescribed for patient. She will follow up in  1 year for surveillance.  She was told to call with any further questions, or with any concerns about this method of contraception.  Emphasized use of condoms 100% of the time for STI prevention.  Emergency Contraception: n/a - continuous use OCP   1. Family planning services -Rx OCP x1 yr. Counseling as above.  -Pap today. - Norgestimate-Ethinyl Estradiol Triphasic (TRI-SPRINTEC) 0.18/0.215/0.25 MG-35 MCG tablet; Take 1 tablet by mouth daily.  Dispense: 13 Package; Refill: 0 - IGP, rfx Aptima HPV ASCU  2. Screening examination for venereal disease -Pt without symptoms. Screenings today as below. Treat wet prep per standing order. -Patient does not meet criteria for HepB, HepC Screening. Declines HIV and syphilis screenings. -Counseled on warning s/sx and when to seek care. Recommended condom use with all sex and discussed importance of condom use for STI prevention. - WET PREP FOR [place], YEAST, CLUE - Chlamydia/Gonorrhea [facility]   Return in about 1 year (around 05/04/2020) for yearly  wellness exam.  No future appointments.  Ann Held, PA-C

## 2019-05-09 LAB — IGP, RFX APTIMA HPV ASCU: PAP Smear Comment: 0

## 2019-05-11 ENCOUNTER — Telehealth: Payer: Self-pay | Admitting: Family Medicine

## 2019-05-11 ENCOUNTER — Encounter: Payer: Self-pay | Admitting: Family Medicine

## 2019-05-11 DIAGNOSIS — R87619 Unspecified abnormal cytological findings in specimens from cervix uteri: Secondary | ICD-10-CM | POA: Insufficient documentation

## 2019-05-11 NOTE — Telephone Encounter (Signed)
Pt wants to duscuss results.

## 2019-05-11 NOTE — Addendum Note (Signed)
Addended by: Geanie Berlin on: 05/11/2019 04:28 PM   Modules accepted: Orders

## 2019-05-12 NOTE — Telephone Encounter (Signed)
TC to patient re: pa results.  Discussed need for colpo and referral to [facility]. Referral completed Richmond Campbell, RN

## 2019-05-13 ENCOUNTER — Telehealth: Payer: Self-pay | Admitting: *Deleted

## 2019-05-27 ENCOUNTER — Ambulatory Visit: Payer: Self-pay | Attending: Oncology | Admitting: *Deleted

## 2019-05-27 DIAGNOSIS — R87612 Low grade squamous intraepithelial lesion on cytologic smear of cervix (LGSIL): Secondary | ICD-10-CM

## 2019-05-27 NOTE — Progress Notes (Signed)
A televisit was used to enroll patient into our [facility] program.  2 identifiers were used to confirm I was speaking to the correct person.  Verbal consent given to be in the [facility] program.  Reviewed last pap smear results of [facility] and need for colposcopy.  Reviewed colposcopy procedure.  Patient has an appointment to see Dr. Bonney Aid on 05/28/19 @ 1:50.  Will follow per [facility] protocol.

## 2019-05-28 ENCOUNTER — Other Ambulatory Visit (HOSPITAL_COMMUNITY)
Admission: RE | Admit: 2019-05-28 | Discharge: 2019-05-28 | Disposition: A | Discharge status: Home / Self Care | Payer: Self-pay | Source: Ambulatory Visit | Attending: Obstetrics and Gynecology | Admitting: Obstetrics and Gynecology

## 2019-05-28 ENCOUNTER — Ambulatory Visit (INDEPENDENT_AMBULATORY_CARE_PROVIDER_SITE_OTHER): Payer: Self-pay | Admitting: Obstetrics and Gynecology

## 2019-05-28 ENCOUNTER — Encounter: Payer: Self-pay | Admitting: Obstetrics and Gynecology

## 2019-05-28 ENCOUNTER — Other Ambulatory Visit: Payer: Self-pay

## 2019-05-28 VITALS — BP 112/80 | Ht 64.0 in | Wt 172.0 lb

## 2019-05-28 DIAGNOSIS — R87612 Low grade squamous intraepithelial lesion on cytologic smear of cervix (LGSIL): Secondary | ICD-10-CM

## 2019-05-28 DIAGNOSIS — R7612 Nonspecific reaction to cell mediated immunity measurement of gamma interferon antigen response without active tuberculosis: Secondary | ICD-10-CM | POA: Insufficient documentation

## 2019-05-28 NOTE — Telephone Encounter (Signed)
completed

## 2019-05-28 NOTE — Progress Notes (Signed)
Obstetrics & Gynecology Office Visit   Chief Complaint:  Chief Complaint  Patient presents with  . Colposcopy    History of Present Illness:Sonya Perez is a 29 y.o. woman who presents today for continued surveillance for history of dysplasia. Last pap obtained on 05/05/2019 revealed LGSIL.  No prior pap smears available for review  Review of Systems: Review of Systems  Constitutional: Negative.   Gastrointestinal: Negative.   Genitourinary: Negative.     Past Medical History:  Patient Active Problem List   Diagnosis Date Noted  . Abnormal Pap smear of cervix 05/11/2019  . History of migraine 05/05/2019    Without aura   . Anxiety 12/11/2017  . Obesity, unspecified 03/02/2014  . 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 Abnormal Pap smear of cervix 05/11/2019  . History of migraine 05/05/2019    Without aura   . Anxiety 12/11/2017  . Obesity, unspecified 03/02/2014  . 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  Obstetric History: No obstetric history on file.  Family History:  Family History  Problem Relation Age of Onset  . Diabetes Maternal Grandmother   . Breast cancer Neg Hx     Social History:  Social History   Socioeconomic History  . Marital status: Single    Spouse name: Not on file  . Number of children: Not on file  . Years of education: Not on file  . Highest education level: Not on file  Occupational History  . Not on file  Tobacco Use  . Smoking status: Former Games developer  . Smokeless tobacco: Never Used  Substance and Sexual Activity  . Alcohol use: Yes    Comment: 1x/mo 1 glass of wine  . Drug use: Never  . Sexual activity: Yes    Partners: Male    Birth control/protection: Pill, Condom  Other Topics Concern  . Not on file  Social History Narrative  . Not on file   Social Determinants of Health    Financial Resource Strain:   . Difficulty of Paying Living Expenses:   Food Insecurity:   . Worried About Programme researcher, broadcasting/film/video in the Last Year:   . Barista in the Last Year:   Transportation Needs:   . Freight forwarder (Medical):   Marland Kitchen Lack of Transportation (Non-Medical):   Physical Activity:   . Days of Exercise per Week:   . Minutes of Exercise per Session:   Stress:   . Feeling of Stress :   Social Connections:   . Frequency of Communication with Friends and Family:   . Frequency of Social Gatherings with Friends and Family:   . Attends Religious Services:   . Active Member of Clubs or Organizations:   . Attends Banker Meetings:   Marland Kitchen Marital Status:   Intimate Partner Violence:   . Fear of Current or Ex-Partner:   . Emotionally Abused:   Marland Kitchen Physically Abused:   . Sexually Abused:     Allergies:  No Known Allergies  Medications: Prior to Admission medications   Medication Sig Start Date End Date Taking? Authorizing Provider  Norgestimate-Ethinyl Estradiol Triphasic (TRI-SPRINTEC) 0.18/0.215/0.25 MG-35 MCG tablet Take 1 tablet by mouth daily. 05/05/19  Yes Staples, Hulda Humphrey, PA-C    Physical Exam Vitals:  Vitals:   05/28/19 1339  BP: 112/80   Patient's last  menstrual period was 05/07/2019.  General: NAD HEENT: normocephalic, anicteric Pulmonary: No increased work of breathing Genitourinary:  External: Normal external female genitalia.  Normal urethral meatus, normal  Bartholin's and Skene's glands.    Vagina: Normal vaginal mucosa, no evidence of prolapse.    Cervix: Grossly normal in appearance, no bleeding  Rectal: deferred  Lymphatic: no evidence of inguinal lymphadenopathy Extremities: no edema, erythema, or tenderness Neurologic: Grossly intact Psychiatric: mood appropriate, affect full  Female chaperone present for pelvic and breast  portions of the physical exam     GYNECOLOGY CLINIC COLPOSCOPY PROCEDURE NOTE  29 y.o.  No obstetric history on file. here for colposcopy for low-grade squamous intraepithelial neoplasia (LGSIL - encompassing HPV,mild dysplasia,CIN I)  pap smear on 05/05/2019. Discussed underlying role for HPV infection in the development of cervical dysplasia, its natural history and progression/regression, need for surveillance.  Is the patient  pregnant: No LMP: Patient's last menstrual period was 05/07/2019. Smoking status:  reports that she has quit smoking. She has never used smokeless tobacco.  Patient given informed consent, signed copy in the chart, time out was performed.  The patient was position in dorsal lithotomy position. Speculum was placed the cervix was visualized.   After application of acetic acid colposcopic inspection of the cervix was undertaken.   Colposcopy adequate, full visualization of transformation zone: Yes no visible lesions, perhaps some slight aceto white at 6 O'Clock; corresponding biopsies obtained.   ECC specimen obtained:  Yes  All specimens were labeled and sent to pathology.   Patient was given post procedure instructions.  Will follow up pathology and manage accordingly.  Routine preventative health maintenance measures emphasized.  OBGyn Exam  Vena Austria, MD, [facility], [facility]    Assessment: 29 y.o. No obstetric history on file. follow up for LGSIL positive pap  Plan: Problem List Items Addressed This Visit      Other   Abnormal Pap smear of cervix - Primary   Relevant Orders   Surgical pathology      -  Colposcopy today  - I had a lengthly discussion with Sonya Perez  regarding the cause of dysplasia of the lower genital tract (including immunosuppression in the setting of HPV exposure and tobacco exposure). I explained the potential for progression to invasive malignancy, the recurrent nature of these lesions (and the need for close continued followup). Results of today's pap will dictate need for  further evaluation and follow up per ASCCP guidelines..  We discussed that 80% of the population will have exposure to human papilloma virus (HPV) during their lifetime.  HPV is a large group of viruses, and are also the causative virus for common warts and genital warts.  The pap smear tests for 13 high risk HPV strains that have some association with cervical cancer, but do not cause visual lesion such as warts.  HPV type 16 and 18 have the highest association with cervical cancer.  The vast majority of HPV infections will be uncomplicated at clear spontaneously in 12-18 months in non-immunocompromised patients.  Patient with compromised immune systems, those taking immunosuppressive drugs, or smoker have shown to have a lower clearance rate and higher persistence of HPV infection.    Currently there are no FDA approved treatments to promote HPV clearance.  Gardasil vaccination is available to prevent HPV infection, but this is only beneficial pre-exposure.  Abstaining from intercourse will not increase clearance  Lastly we stressed that if properly followed HPV should not  lead to cervical cancer.   The goal of screening is to identify patient who develop precancerous lesions of the cervix and treat these prior to progression to frank cervical cancer.  The incidence of cervical cancer is 7 cases per 100,000 women in the [place] a year.  This relatively low rate is in part due to universal screening as well as vaccination efforts.    - She is comfortable with the plan and had her questions answered.  - Return in about 1 year (around 05/27/2020) for annual.   Malachy Mood, MD, [place], [facility] 05/28/2019, 2:19 PM

## 2019-06-01 LAB — SURGICAL PATHOLOGY

## 2019-06-05 ENCOUNTER — Telehealth (LOCAL_COMMUNITY_HEALTH_CENTER): Payer: Self-pay | Admitting: Family Medicine

## 2019-06-05 ENCOUNTER — Encounter: Payer: Self-pay | Admitting: Family Medicine

## 2019-06-05 DIAGNOSIS — B009 Herpesviral infection, unspecified: Secondary | ICD-10-CM

## 2019-06-05 MED ORDER — ACYCLOVIR 800 MG PO TABS: 800.0000 mg | ORAL_TABLET | Freq: Three times a day (TID) | ORAL | 12 refills | Status: DC

## 2019-06-05 NOTE — Telephone Encounter (Signed)
Pt .has questions about her last physical.

## 2019-06-05 NOTE — Telephone Encounter (Signed)
TC from patient.  Reports is having an outbreak. Patient dx with HSV 2 07/22/2012 per paper chart. Per C. Rolley Sims ok to call in Acyclovir 800mg  1 po TID x 2 days, refills x 12. Patient pharmacy verified. , RN

## 2019-06-08 ENCOUNTER — Other Ambulatory Visit: Payer: Self-pay | Admitting: Family Medicine

## 2019-06-08 NOTE — Telephone Encounter (Signed)
NEEDS ANTIBIOTIC REFILL

## 2019-06-08 NOTE — Telephone Encounter (Signed)
TC to patient who states she needs more medication for herpes as she feels symptoms of possible outbreak. Per patient chart, patient has 12 refills available. Patient counseled to call her pharmacy for a refill, and to call back if she has any problems or other questions. Patient in agreement with plan.Burt Knack, RN

## 2019-06-12 ENCOUNTER — Encounter: Payer: Self-pay | Admitting: *Deleted

## 2019-06-12 NOTE — Progress Notes (Signed)
Letter mailed to inform patient of her appointment for her next pap smear on 05/18/20 @ 11:00.

## 2019-11-09 ENCOUNTER — Other Ambulatory Visit: Payer: Self-pay

## 2019-11-09 ENCOUNTER — Ambulatory Visit: Payer: Self-pay | Admitting: Physician Assistant

## 2019-11-09 DIAGNOSIS — B3731 Acute candidiasis of vulva and vagina: Secondary | ICD-10-CM

## 2019-11-09 DIAGNOSIS — Z113 Encounter for screening for infections with a predominantly sexual mode of transmission: Secondary | ICD-10-CM

## 2019-11-09 DIAGNOSIS — B373 Candidiasis of vulva and vagina: Secondary | ICD-10-CM

## 2019-11-09 LAB — WET PREP FOR TRICH, YEAST, CLUE
Trichomonas Exam: NEGATIVE
Yeast Exam: NEGATIVE

## 2019-11-09 MED ORDER — CLOTRIMAZOLE 1 % VA CREA: 1.0000 | TOPICAL_CREAM | Freq: Every day | VAGINAL | 0 refills | Status: AC

## 2019-11-09 NOTE — Progress Notes (Signed)
Wet Mount results reviewed. Provider orders completed. Calhoun Reichardt, RN  

## 2019-11-10 ENCOUNTER — Encounter: Payer: Self-pay | Admitting: Physician Assistant

## 2019-11-10 NOTE — Progress Notes (Signed)
[facility] Department STI clinic/screening visit  Subjective:  Sonya Perez is a 29 y.o. female being seen today for an STI screening visit. The patient reports they do not have symptoms.  Patient reports that they do not desire a pregnancy in the next year.   They reported they are not interested in discussing contraception today.  Patient's last menstrual period was 10/31/2019.   Patient has the following medical conditions:   Patient Active Problem List   Diagnosis Date Noted  . Abnormal Pap smear of cervix 05/11/2019  . History of migraine 05/05/2019  . Anxiety 12/11/2017  . Obesity, unspecified 03/02/2014  . 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 SEXUALLY TRANSMITTED DISEASE    screening    HPI  Patient reports that she is not having any symptoms but would like a screening today.  Denies chronic conditions and surgeries.  States that she is using OCs as her BCM.  Last HIV test was about 1 year ago and last pap was less than a year ago.   See flowsheet for further details and programmatic requirements.    The following portions of the patient's history were reviewed and updated as appropriate: allergies, current medications, past medical history, past social history, past surgical history and problem list.  Objective:  There were no vitals filed for this visit.  Physical Exam Constitutional:      General: She is not in acute distress.    Appearance: Normal appearance.  HENT:     Head: Normocephalic and atraumatic.     Comments: No nits,lice, or hair loss. No cervical, supraclavicular or axillary adenopathy.    Mouth/Throat:     Mouth: Mucous membranes are moist.     Pharynx: Oropharynx is clear. No oropharyngeal exudate or posterior oropharyngeal erythema.  Eyes:     Conjunctiva/sclera: Conjunctivae normal.  Pulmonary:     Effort: Pulmonary effort is normal.  Abdominal:     Palpations: Abdomen is soft. There is  no mass.     Tenderness: There is no abdominal tenderness. There is no guarding or rebound.  Genitourinary:    General: Normal vulva.     Rectum: Normal.     Comments: External genitalia/pubic area without nits, lice, edema, erythema, lesions and inguinal adenopathy. Vagina with normal mucosa and moderate amount of clumping, whitish  discharge. Cervix without visible lesions. Uterus firm, mobile, nt, no masses, no CMT, no adnexal tenderness or fullness. Musculoskeletal:     Cervical back: Neck supple. No tenderness.  Skin:    General: Skin is warm and dry.     Findings: No bruising, erythema, lesion or rash.  Neurological:     Mental Status: She is alert and oriented to person, place, and time.  Psychiatric:        Mood and Affect: Mood normal.        Thought Content: Thought content normal.        Judgment: Judgment normal.      Assessment and Plan:  Sonya Perez is a 29 y.o. female presenting to the [facility] Department for STI screening  1. Screening for STD (sexually transmitted disease) Patient into clinic without symptoms. Rec condoms with all sex. Await test results.  Counseled that RN will call if needs to RTC for treatment once results are back. - WET PREP FOR TRICH, YEAST, CLUE - Gonococcus culture - Chlamydia/Gonorrhea [place] [facility] - HIV [place] [facility] - Syphilis Serology, [place]  [facility]  2. Candida vaginitis Treat for yeast with Clotrimazole 1 % vaginal cream 1 app qhs for 7 days. No sex for 7 days. - clotrimazole (CLOTRIMAZOLE-7) 1 % vaginal cream; Place 1 Applicatorful vaginally at bedtime for 7 days.  Dispense: 45 g; Refill: 0     No follow-ups on file.  Future Appointments  Date Time Provider Department Center  11/13/2019  8:45 AM [facility] NURSE [facility] None  05/18/2020 11:00 AM [facility] CLINIC [facility] None    Matt Holmes, [place]

## 2019-11-13 ENCOUNTER — Ambulatory Visit (LOCAL_COMMUNITY_HEALTH_CENTER): Payer: Self-pay

## 2019-11-13 ENCOUNTER — Other Ambulatory Visit: Payer: Self-pay

## 2019-11-13 VITALS — BP 122/79 | Ht 64.0 in | Wt 166.0 lb

## 2019-11-13 DIAGNOSIS — Z3009 Encounter for other general counseling and advice on contraception: Secondary | ICD-10-CM

## 2019-11-13 MED ORDER — NORGESTIM-ETH ESTRAD TRIPHASIC 0.18/0.215/0.25 MG-35 MCG PO TABS: 1.0000 | ORAL_TABLET | Freq: Every day | ORAL | 0 refills | Status: DC

## 2019-11-13 NOTE — Addendum Note (Signed)
Addended by: Richmond Campbell on: 11/13/2019 11:59 AM   Modules accepted: Orders

## 2019-11-13 NOTE — Progress Notes (Signed)
#  5 packs of Trisprintec disp to patient Richmond Campbell, RN

## 2019-11-14 LAB — GONOCOCCUS CULTURE

## 2020-03-07 ENCOUNTER — Ambulatory Visit: Payer: Self-pay | Admitting: Physician Assistant

## 2020-03-07 ENCOUNTER — Other Ambulatory Visit: Payer: Self-pay

## 2020-03-07 DIAGNOSIS — Z113 Encounter for screening for infections with a predominantly sexual mode of transmission: Secondary | ICD-10-CM

## 2020-03-07 LAB — WET PREP FOR TRICH, YEAST, CLUE
Trichomonas Exam: NEGATIVE
Yeast Exam: NEGATIVE

## 2020-03-07 NOTE — Progress Notes (Signed)
Wet mount reviewed by provider, no tx per provider orders. Provider orders completed. 

## 2020-03-08 ENCOUNTER — Encounter: Payer: Self-pay | Admitting: Physician Assistant

## 2020-03-08 NOTE — Progress Notes (Signed)
[facility] Department STI clinic/screening visit  Subjective:  Sonya Perez is a 30 y.o. female being seen today for an STI screening visit. The patient reports they do have symptoms.  Patient reports that they do not desire a pregnancy in the next year.   They reported they are not interested in discussing contraception today.  Patient's last menstrual period was 02/15/2020.   Patient has the following medical conditions:   Patient Active Problem List   Diagnosis Date Noted  . Abnormal Pap smear of cervix 05/11/2019  . History of migraine 05/05/2019  . Anxiety 12/11/2017  . Obesity, unspecified 03/02/2014  . 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 SEXUALLY TRANSMITTED DISEASE    screening    HPI  Patient reports that she has noticed a slight increase in normal discharge but no other symptoms and would like a screening today.  Denies chronic conditions and surgeries.  Currently is using OCs as her BCM.  Reports last HIV test was 10/2019 and last pap was in 2021, as well.  See flowsheet for further details and programmatic requirements.    The following portions of the patient's history were reviewed and updated as appropriate: allergies, current medications, past medical history, past social history, past surgical history and problem list.  Objective:  There were no vitals filed for this visit.  Physical Exam Constitutional:      General: She is not in acute distress.    Appearance: Normal appearance.  HENT:     Head: Normocephalic and atraumatic.     Comments: No nits,lice, or hair loss. No cervical, supraclavicular or axillary adenopathy.    Mouth/Throat:     Mouth: Mucous membranes are moist.     Pharynx: Oropharynx is clear. No oropharyngeal exudate or posterior oropharyngeal erythema.  Eyes:     Conjunctiva/sclera: Conjunctivae normal.  Pulmonary:     Effort: Pulmonary effort is normal.  Abdominal:     Palpations:  Abdomen is soft. There is no mass.     Tenderness: There is no abdominal tenderness. There is no guarding or rebound.  Genitourinary:    General: Normal vulva.     Rectum: Normal.     Comments: External genitalia/pubic area without nits, lice, edema, erythema, lesions and inguinal adenopathy. Vagina with normal mucosa and discharge. Cervix without visible lesions. Uterus firm, mobile, nt, no masses, no CMT, no adnexal tenderness or fullness. Musculoskeletal:     Cervical back: Neck supple. No tenderness.  Skin:    General: Skin is warm and dry.     Findings: No bruising, erythema, lesion or rash.  Neurological:     Mental Status: She is alert and oriented to person, place, and time.  Psychiatric:        Mood and Affect: Mood normal.        Behavior: Behavior normal.        Thought Content: Thought content normal.        Judgment: Judgment normal.      Assessment and Plan:  STARLETTE THUROW is a 30 y.o. female presenting to the [facility] Department for STI screening  1. Screening for STD (sexually transmitted disease) Patient into clinic with symptoms. Patient declines blood work today. Rec condoms with all sex. Await test results.  Counseled that RN will call if needs to RTC for treatment once results are back. - WET PREP FOR TRICH, YEAST, CLUE - Gonococcus culture - Chlamydia/Gonorrhea [place] [facility]  No follow-ups on file.  Future Appointments  Date Time Provider Department Center  05/18/2020 11:00 AM [facility] CLINIC [facility] None    Matt Holmes, [place]

## 2020-03-12 LAB — GONOCOCCUS CULTURE

## 2020-04-21 ENCOUNTER — Other Ambulatory Visit: Payer: Self-pay | Admitting: Family Medicine

## 2020-04-22 ENCOUNTER — Other Ambulatory Visit: Payer: Self-pay | Admitting: Advanced Practice Midwife

## 2020-04-22 DIAGNOSIS — Z3041 Encounter for surveillance of contraceptive pills: Secondary | ICD-10-CM

## 2020-04-22 MED ORDER — NORGESTIM-ETH ESTRAD TRIPHASIC 0.18/0.215/0.25 MG-35 MCG PO TABS: 1.0000 | ORAL_TABLET | Freq: Every day | ORAL | 0 refills | Status: DC

## 2020-05-17 ENCOUNTER — Other Ambulatory Visit: Payer: Self-pay

## 2020-05-17 ENCOUNTER — Other Ambulatory Visit: Payer: Self-pay | Admitting: Advanced Practice Midwife

## 2020-05-17 ENCOUNTER — Ambulatory Visit (LOCAL_COMMUNITY_HEALTH_CENTER): Payer: Self-pay

## 2020-05-17 VITALS — BP 124/81 | Ht 64.0 in | Wt 166.0 lb

## 2020-05-17 DIAGNOSIS — Z3009 Encounter for other general counseling and advice on contraception: Secondary | ICD-10-CM

## 2020-05-17 DIAGNOSIS — Z3041 Encounter for surveillance of contraceptive pills: Secondary | ICD-10-CM

## 2020-05-17 MED ORDER — NORGESTIM-ETH ESTRAD TRIPHASIC 0.18/0.215/0.25 MG-35 MCG PO TABS: 1.0000 | ORAL_TABLET | Freq: Every day | ORAL | 0 refills | Status: DC

## 2020-05-17 NOTE — Progress Notes (Signed)
In Nurse Clinic requesting more ocp (TriSprintec). Upon Epic review, pt received total of 12 packs TriSprintec from order by Maximiano Coss, PA-C dated 05/05/2019.  Has 2 pills left in current pack which she received from [facility] from E. Sciora's order dated 04/22/2020.  Pt says she did not realize her physical is due. Denies missing any pills and takes at same time each day.  Consult Elveria Rising, FNP who advises that pt may have one pack of TriSprintec from order by Maximiano Coss, PA-C dated 05/05/2019. This would complete that order which was written for total of 13 packs TriSprintec. TriSprintec #1 pack dispensed today with instructions given. Provider advises pt to schedule annual PE before leaving today and to schedule appt before runs out of pills. RN carried out provider orders. RN escorted pt to clerk to schedule annual PE which is scheduled 05/30/2020.  Jerel Shepherd, RN

## 2020-05-18 ENCOUNTER — Ambulatory Visit: Payer: Self-pay | Attending: Oncology

## 2020-05-20 ENCOUNTER — Other Ambulatory Visit: Payer: Self-pay | Admitting: Advanced Practice Midwife

## 2020-05-20 DIAGNOSIS — Z3041 Encounter for surveillance of contraceptive pills: Secondary | ICD-10-CM

## 2020-05-30 ENCOUNTER — Other Ambulatory Visit: Payer: Self-pay

## 2020-05-30 ENCOUNTER — Ambulatory Visit (LOCAL_COMMUNITY_HEALTH_CENTER): Payer: Self-pay | Admitting: Physician Assistant

## 2020-05-30 ENCOUNTER — Encounter: Payer: Self-pay | Admitting: Physician Assistant

## 2020-05-30 VITALS — BP 135/91 | Ht 64.0 in | Wt 168.0 lb

## 2020-05-30 DIAGNOSIS — Z299 Encounter for prophylactic measures, unspecified: Secondary | ICD-10-CM

## 2020-05-30 DIAGNOSIS — Z01419 Encounter for gynecological examination (general) (routine) without abnormal findings: Secondary | ICD-10-CM

## 2020-05-30 DIAGNOSIS — Z3041 Encounter for surveillance of contraceptive pills: Secondary | ICD-10-CM

## 2020-05-30 DIAGNOSIS — Z3009 Encounter for other general counseling and advice on contraception: Secondary | ICD-10-CM

## 2020-05-30 MED ORDER — ACYCLOVIR 800 MG PO TABS: 800.0000 mg | ORAL_TABLET | Freq: Three times a day (TID) | ORAL | 12 refills | Status: DC

## 2020-05-30 MED ORDER — NORGESTIM-ETH ESTRAD TRIPHASIC 0.18/0.215/0.25 MG-35 MCG PO TABS
1.0000 | ORAL_TABLET | Freq: Every day | ORAL | 13 refills | Status: DC
Start: 2020-05-30 — End: 2021-05-23

## 2020-05-30 NOTE — Progress Notes (Signed)
Tri Sprintec # 8 given due to exp. Date of 01/28/2021.  Pt informed to get additional packs in Jan. 2023. Berdie Ogren, RN

## 2020-05-31 NOTE — Progress Notes (Signed)
[facility] DEPARTMENT [facility] [address] Main Number: [phone]    Family Planning Visit- Initial Visit  Subjective:  Sonya Perez is a 30 y.o.  G1P1   being seen today for an initial annual visit and to discuss contraceptive options.  The patient is currently using Combination OCPs for pregnancy prevention. Patient reports she does not want a pregnancy in the next year.  Patient has the following medical conditions has Anxiety; Obesity, unspecified; HSV-2 infection; History of migraine; and Abnormal Pap smear of cervix on their problem list.  Chief Complaint  Patient presents with  . Contraception    Annual exam and OCs    Patient reports that she is doing well with her current OCs and desires to continue with them as her BCM.  Reports that she had a colpo last year and has not followed up yet for her repeat pap.  States that she would like to get her pap done today while she is here.  Requests that a Rx be sent to her pharmacy for episodic treatment for HSV.  Per chart review, CBE is due in 2024 and will do pap today.    Patient denies any concerns today.   Body mass index is 28.84 kg/m. - Patient is eligible for diabetes screening based on BMI and age >57?  not applicable HA1C ordered? not applicable  Patient reports 1  partner/s in last year. Desires STI screening?  No - patient declines.  Has patient been screened once for HCV in the past?  No  No results found for: HCVAB  Does the patient have current drug use (including MJ), have a partner with drug use, and/or has been incarcerated since last result? No  If yes-- Screen for HCV through [facility]   Does the patient meet criteria for HBV testing? No  Criteria:  -Household, sexual or needle sharing contact with HBV -History of drug use -HIV positive -Those with known Hep C   Health Maintenance Due  Topic Date Due  . Hepatitis C Screening  Never done  . COVID-19  Vaccine (1) Never done  . PAP SMEAR-Modifier  05/04/2020    Review of Systems  All other systems reviewed and are negative.   The following portions of the patient's history were reviewed and updated as appropriate: allergies, current medications, past family history, past medical history, past social history, past surgical history and problem list. Problem list updated.   See flowsheet for other program required questions.  Objective:   Vitals:   05/30/20 0905  BP: (!) 135/91  Weight: 168 lb (76.2 kg)  Height: 5\' 4"  (1.626 m)    Physical Exam Vitals and nursing note reviewed.  Constitutional:      General: She is not in acute distress.    Appearance: Normal appearance.  HENT:     Head: Normocephalic and atraumatic.     Mouth/Throat:     Mouth: Mucous membranes are moist.     Pharynx: Oropharynx is clear. No oropharyngeal exudate or posterior oropharyngeal erythema.  Eyes:     Conjunctiva/sclera: Conjunctivae normal.  Neck:     Thyroid: No thyroid mass, thyromegaly or thyroid tenderness.  Cardiovascular:     Rate and Rhythm: Normal rate and regular rhythm.  Pulmonary:     Effort: Pulmonary effort is normal.     Breath sounds: Normal breath sounds.  Abdominal:     Palpations: Abdomen is soft. There is no mass.  Tenderness: There is no abdominal tenderness. There is no guarding or rebound.  Genitourinary:    General: Normal vulva.     Rectum: Normal.     Comments: External genitalia/pubic area without nits, lice, edema, erythema, lesions and inguinal adenopathy. Vagina with normal mucosa and discharge. Cervix without visible lesions. Uterus firm, mobile, nt, no masses, no CMT, no adnexal tenderness or fullness. Musculoskeletal:     Cervical back: Neck supple. No tenderness.  Lymphadenopathy:     Cervical: No cervical adenopathy.  Skin:    General: Skin is warm and dry.     Findings: No bruising, erythema, lesion or rash.  Neurological:     Mental Status:  She is alert and oriented to person, place, and time.  Psychiatric:        Mood and Affect: Mood normal.        Behavior: Behavior normal.        Thought Content: Thought content normal.        Judgment: Judgment normal.       Assessment and Plan:  Sonya Perez is a 30 y.o. female presenting to the [facility] Department for an initial annual wellness/contraceptive visit  Contraception counseling: Reviewed all forms of birth control options in the tiered based approach. available including abstinence; over the counter/barrier methods; hormonal contraceptive medication including pill, patch, ring, injection,contraceptive implant, ECP; hormonal and nonhormonal IUDs; permanent sterilization options including vasectomy and the various tubal sterilization modalities. Risks, benefits, and typical effectiveness rates were reviewed.  Questions were answered.  Written information was also given to the patient to review.  Patient desires to continue with her current OC, this was prescribed for patient. She will follow up in  1 year and prn for surveillance.  She was told to call with any further questions, or with any concerns about this method of contraception.  Emphasized use of condoms 100% of the time for STI prevention.  Patient was not a candidate for ECP today.   1. Encounter for counseling regarding contraception Reviewed with patient normal SE of OCs and when to call clinic with concerns. Enc condoms with all sex for STD protection and enc especially if late OCs.  2. Well woman exam with routine gynecological exam Reviewed with patient healthy habits to maintain general health. Enc MVI 1 po daily. Enc to establish with/ follow up with PCP for primary care concerns, age appropriate screenings and illness. Await pap results.  Counseled that RN will call or send letter once results are back with follow up plan. - IGP, Aptima HPV  3. Surveillance of previously prescribed  contraceptive pill BP on intake was 131/91 and patient had been drinking coffee prior to visit.  BP recheck by me after exam was 128/84. Tri Sprintec 28 d #13 1 po daily at the same time each day given to patient to continue. - Norgestimate-Ethinyl Estradiol Triphasic (TRI-SPRINTEC) 0.18/0.215/0.25 MG-35 MCG tablet; Take 1 tablet by mouth daily.  Dispense: 28 tablet; Refill: 13  4. Prophylactic measure Rx for Acycylovir 800 mg #6 1 po TID for 2 days as needed with refills x 12 sent to patient's pharmacy of choice per her request. - acyclovir (ZOVIRAX) 800 MG tablet; Take 1 tablet (800 mg total) by mouth 3 (three) times daily. Take 1 po TID for 2 days prn.  Dispense: 6 tablet; Refill: 12     Return in about 1 year (around 05/30/2021) for RP and prn.  No future appointments.  Matt Holmes, PA

## 2020-06-03 LAB — IGP, APTIMA HPV
HPV Aptima: POSITIVE — AB
PAP Smear Comment: 0

## 2020-06-04 NOTE — Progress Notes (Signed)
Chart reviewed by Pharmacist  Suzanne Walker PharmD, Contract Pharmacist at [facility]  

## 2020-06-09 ENCOUNTER — Telehealth: Payer: Self-pay | Admitting: *Deleted

## 2020-06-29 ENCOUNTER — Other Ambulatory Visit: Payer: Self-pay

## 2020-06-29 ENCOUNTER — Ambulatory Visit: Payer: Self-pay

## 2020-07-27 ENCOUNTER — Encounter: Payer: Self-pay | Admitting: Nurse Practitioner

## 2020-07-27 NOTE — Progress Notes (Signed)
Telephone call to patient regarding Colpo appointment with [facility].  Patient states that she has not scheduled her appointment at this time due to conflicting schedules.  Stressed with the patient the importance of the procedure.  Patient states that she will follow up with [facility] as soon as possible.  Will follow up at a later date to ensure that appointment was scheduled.  Glenna Fellows, RN

## 2020-08-12 ENCOUNTER — Ambulatory Visit: Payer: Self-pay

## 2021-01-02 ENCOUNTER — Telehealth: Payer: Self-pay

## 2021-01-02 NOTE — Telephone Encounter (Signed)
Telephone call to patient to inquire about her Colpo referral to [facility].  Patient reports going to another [place] location for a PAP and it was normal.  She cannot recall the name.  I asked her to please have them send [facility] a copy of the records. I explained to patient our 05/30/2020 PA{P was abnormal and the provider wanted her to have a Colpo.  Patient not interested in that.  Close to PAP f/u until patient notifies [facility] to schedule the appointment. Hart Carwin, RN

## 2021-01-05 ENCOUNTER — Other Ambulatory Visit: Payer: Self-pay

## 2021-01-05 ENCOUNTER — Ambulatory Visit (LOCAL_COMMUNITY_HEALTH_CENTER): Payer: Self-pay

## 2021-01-05 VITALS — BP 118/79 | Ht 64.0 in | Wt 167.0 lb

## 2021-01-05 DIAGNOSIS — Z3041 Encounter for surveillance of contraceptive pills: Secondary | ICD-10-CM

## 2021-01-05 DIAGNOSIS — Z3009 Encounter for other general counseling and advice on contraception: Secondary | ICD-10-CM

## 2021-01-05 MED ORDER — NORGESTIM-ETH ESTRAD TRIPHASIC 0.18/0.215/0.25 MG-35 MCG PO TABS: 1.0000 | ORAL_TABLET | Freq: Every day | ORAL | 0 refills | Status: DC

## 2021-01-05 NOTE — Progress Notes (Signed)
Tri-Sprintec # 5 packs dispensed today per 05/30/2020 order by Sadie Haber, PA.  # 8 packs dispensed on 05/30/2020 per #13 pack order originally 05/30/2020. Hart Carwin, RN

## 2021-05-18 ENCOUNTER — Telehealth: Payer: Self-pay | Admitting: Family Medicine

## 2021-05-18 ENCOUNTER — Other Ambulatory Visit: Payer: Self-pay | Admitting: Family Medicine

## 2021-05-18 DIAGNOSIS — B009 Herpesviral infection, unspecified: Secondary | ICD-10-CM

## 2021-05-18 MED ORDER — ACYCLOVIR 800 MG PO TABS: 800.0000 mg | ORAL_TABLET | Freq: Three times a day (TID) | ORAL | 0 refills | Status: DC

## 2021-05-18 NOTE — Telephone Encounter (Signed)
Pt calls & requests refill for Acyclovir to [facility] [place] ?

## 2021-05-18 NOTE — Telephone Encounter (Signed)
Pt called says she needs antibiotic refill. Pt states she spoke to a nurse about is and nurse left her a voicemail  ?

## 2021-05-18 NOTE — Progress Notes (Signed)
RX sent for HSV medication x 1.  Pt appointment 05/2021 ? ?Wendi Snipes, FNP ? ?

## 2021-05-22 ENCOUNTER — Telehealth: Payer: Self-pay | Admitting: Family Medicine

## 2021-05-22 NOTE — Telephone Encounter (Signed)
Pt called for a 2nd time today to see if you can get her refill sent over the pharmacy today. She says she got sick and threw up, so she could not finish her medication. Has an appt tomorrow with [place]. Please call her back to provide an answer. Thanks ?

## 2021-05-22 NOTE — Telephone Encounter (Signed)
Pt called  back after missing a call from Nurse. Please call again. ?

## 2021-05-22 NOTE — Telephone Encounter (Signed)
Please call regarding prescription of antibiotic tx.  Pt would like to request a re-fill. ?

## 2021-05-23 ENCOUNTER — Ambulatory Visit (LOCAL_COMMUNITY_HEALTH_CENTER): Payer: No Typology Code available for payment source | Admitting: Family Medicine

## 2021-05-23 ENCOUNTER — Encounter: Payer: No Typology Code available for payment source | Admitting: Family Medicine

## 2021-05-23 DIAGNOSIS — Z113 Encounter for screening for infections with a predominantly sexual mode of transmission: Secondary | ICD-10-CM

## 2021-05-23 DIAGNOSIS — B009 Herpesviral infection, unspecified: Secondary | ICD-10-CM

## 2021-05-23 DIAGNOSIS — Z3041 Encounter for surveillance of contraceptive pills: Secondary | ICD-10-CM

## 2021-05-23 DIAGNOSIS — Z0000 Encounter for general adult medical examination without abnormal findings: Secondary | ICD-10-CM

## 2021-05-23 LAB — WET PREP FOR TRICH, YEAST, CLUE
Trichomonas Exam: NEGATIVE
Yeast Exam: NEGATIVE

## 2021-05-23 MED ORDER — ACYCLOVIR 800 MG PO TABS: 800.0000 mg | ORAL_TABLET | Freq: Every day | ORAL | 12 refills | Status: DC

## 2021-05-23 MED ORDER — NORGESTIM-ETH ESTRAD TRIPHASIC 0.18/0.215/0.25 MG-35 MCG PO TABS: 1.0000 | ORAL_TABLET | Freq: Every day | ORAL | 12 refills | Status: DC

## 2021-05-23 NOTE — Progress Notes (Signed)
[facility] DEPARTMENT ?Family Planning [facility] ?[address] [employer] ?Main Number: [phone] ? ?Family Planning Visit- Repeat Yearly Visit ? ?Subjective:  ?Sonya Perez is a 31 y.o. G1P1  being seen today for an annual wellness visit and to discuss contraception options.   The patient is currently using Oral Contraceptive for pregnancy prevention. Patient does not want a pregnancy in the next year.  ? ?she/her/hers report they are looking for a method that provides Cycle control, Does not want something inserted, and Method they can control starting/stopping ? ? ?Patient has the following medical problems: has Anxiety; Obesity, unspecified; HSV-2 infection; History of migraine; and Abnormal Pap smear of cervix on their problem list. ? ?Chief Complaint  ?Patient presents with  ? Annual Exam  ?  PE and BCM  ? ? ?Patient reports here for physical, STI testing and continue BCM  ? ?Patient denies any additional concerns.   ? ?See flowsheet for other program required questions.  ? ?There is no height or weight on file to calculate BMI. - Patient is eligible for diabetes screening based on BMI and age >69?  not applicable ?HA1C ordered? not applicable ? ?Patient reports 1 of partners in last year. Desires STI screening?  Yes ? ? ?Has patient been screened once for HCV in the past?  No ? No results found for: HCVAB ? ?Does the patient have current of drug use, have a partner with drug use, and/or has been incarcerated since last result? No  ?If yes-- Screen for HCV through [facility] ?  ?Does the patient meet criteria for HBV testing? No ? ?Criteria:  ?-Household, sexual or needle sharing contact with HBV ?-History of drug use ?-HIV positive ?-Those with known Hep C ? ? ?Health Maintenance Due  ?Topic Date Due  ? COVID-19 Vaccine (1) Never done  ? Hepatitis C Screening  Never done  ? PAP SMEAR-Modifier  05/30/2021  ? ? ?Review of Systems  ?Constitutional:  Negative for chills, fever, malaise/fatigue  and weight loss.  ?HENT:  Negative for congestion, hearing loss and sore throat.   ?Eyes:  Negative for blurred vision, double vision and photophobia.  ?Respiratory:  Negative for shortness of breath.   ?Cardiovascular:  Negative for chest pain.  ?Gastrointestinal:  Negative for abdominal pain, blood in stool, constipation, diarrhea, heartburn, nausea and vomiting.  ?Genitourinary:  Negative for dysuria and frequency.  ?Musculoskeletal:  Negative for back pain, joint pain and neck pain.  ?Skin:  Negative for itching and rash.  ?Neurological:  Negative for dizziness, weakness and headaches.  ?Endo/Heme/Allergies:  Does not bruise/bleed easily.  ?Psychiatric/Behavioral:  Negative for depression, substance abuse and suicidal ideas.   ? ?The following portions of the patient's history were reviewed and updated as appropriate: allergies, current medications, past family history, past medical history, past social history, past surgical history and problem list. Problem list updated. ? ?Objective:  ?There were no vitals filed for this visit. ? ?Physical Exam ?Vitals and nursing note reviewed.  ?Constitutional:   ?   Appearance: Normal appearance.  ?HENT:  ?   Head: Normocephalic and atraumatic.  ?   Mouth/Throat:  ?   Mouth: Mucous membranes are moist.  ?   Dentition: Normal dentition. No dental caries.  ?   Pharynx: Posterior oropharyngeal erythema present. No oropharyngeal exudate.  ?Eyes:  ?   General: No scleral icterus. ?Neck:  ?   Thyroid: No thyroid mass, thyromegaly or thyroid tenderness.  ?Cardiovascular:  ?  Rate and Rhythm: Normal rate and regular rhythm.  ?   Pulses: Normal pulses.  ?   Heart sounds: Normal heart sounds.  ?Pulmonary:  ?   Effort: Pulmonary effort is normal.  ?   Breath sounds: Normal breath sounds.  ?Abdominal:  ?   General: Abdomen is flat. Bowel sounds are normal.  ?   Palpations: Abdomen is soft.  ?Genitourinary: ?   General: Normal vulva.  ?   Rectum: Normal.  ?   Comments: External  genitalia without, lice, nits, erythema, edema, or inguinal adenopathy. 2 healing lesions on right labia, ~2 mm. (Recent HSV outbreak). Vagina with normal mucosa and thick white discharge and pH equals 4.  Cervix without visual lesions, uterus firm, mobile, non-tender, no masses, CMT adnexal fullness or tenderness.  ? ?Musculoskeletal:     ?   General: Normal range of motion.  ?   Cervical back: Normal range of motion and neck supple.  ?Skin: ?   General: Skin is warm and dry.  ?Neurological:  ?   General: No focal deficit present.  ?   Mental Status: She is alert.  ? ? ? ? ?Assessment and Plan:  ?Sonya Perez is a 31 y.o. female G1P1 presenting to the [facility] Department for an yearly wellness and contraception visit ? ? ?Contraception counseling: Reviewed options based on patient desire and reproductive life plan. Patient is interested in Oral Contraceptive. This was provided to the patient today.  ? ?Risks, benefits, and typical effectiveness rates were reviewed.  Questions were answered.  Written information was also given to the patient to review.   ? ?The patient will follow up in  1 years for surveillance.  The patient was told to call with any further questions, or with any concerns about this method of contraception.  Emphasized use of condoms 100% of the time for STI prevention. ? ?Patient was assessed for need for ECP. Patient was not offered ECP based on st sexand current BCM.   Patient is within 7 days of unprotected sex. Patient was not offered ECP.  ? ?1. Routine general medical examination at a health care facility ?Well woman exam today  ?Pap today  ?CBE due 202 ?During pap and STI  sample collection, when questioned about discharge, pt reports using boric suppository last pm.  Informed patient of possibility of inaccurate results and may need re-screen.  ?Pt throat slightly erythema from post nasal drainage  ?- IGP, Aptima HPV ? ?2. Encounter for surveillance of contraceptive  pills ?Pt has 2 weeks left of last pack of pills.  ?No missed pills or late pills.  ?Desires to continue with current OCP 's  ?- Norgestimate-Ethinyl Estradiol Triphasic (TRI-SPRINTEC) 0.18/0.215/0.25 MG-35 MCG tablet; Take 1 tablet by mouth daily.  Dispense: 28 tablet; Refill: 12 ? ?3. Screening examination for venereal disease ?Patient accepted all screenings including wet prep, vaginal CT/GC and bloodwork for HIV/RPR.  ? ?Wet prep results neg    ?No Treatment needed ?Discussed time line for State Lab results and that patient will be called with positive results and encouraged patient to call if she had not heard in 2 weeks.  ? ?- Chlamydia/Gonorrhea [place] [facility] ?- HIV [place] [facility] ?- WET PREP FOR TRICH, YEAST, CLUE ?- Syphilis Serology, [place] [facility] ? ?4. HSV-2 infection ?Desires to start suppressive therapy.   ?Last outbreak was 1 week ago but previous was 2 months ago.   ?Rx sent to listed pharmacy.   ? ?-  acyclovir (ZOVIRAX) 800 MG tablet; Take 1 tablet (800 mg total) by mouth daily.  Dispense: 30 tablet; Refill: 12 ? ? ?Return in about 1 year (around 05/24/2022) for annual well woman exam. ? ?Future Appointments  ?Date Time Provider Department Center  ?05/31/2021 10:40 AM [facility] PROVIDER [facility] None  ? ? ?Wendi SnipesMeteea Dempsy Damiano, FNP ?

## 2021-05-23 NOTE — Progress Notes (Signed)
Pt here for PE, Pap, BC and STD check.  Wet mount results reviewed, no treatment required per SO.  Condoms and Tri Sprintec #12 given.  Berdie Ogren, RN ? ? ?

## 2021-05-23 NOTE — Progress Notes (Signed)
? ?  Established Patient Office Visit ? ?Subjective   ?Patient ID: Sonya Perez, female    DOB: Aug 09, 1990  Age: 31 y.o. MRN: [id number] ? ?Chief Complaint  ?Patient presents with  ? Annual Exam  ?  PE and BCM  ? ? ?HPI ? ? ? ?ROS ? ?  ?Objective:  ?  ? ?There were no vitals taken for this visit. ? ? ?Physical Exam ? ? ?No results found for any visits on 05/23/21. ? ? ? ?The ASCVD Risk score (Arnett DK, et al., 2019) failed to calculate for the following reasons: ?  The 2019 ASCVD risk score is only valid for ages 61 to 83 ? ?  ?Assessment & Plan:  ? ?Problem List Items Addressed This Visit   ? ?  ? Other  ? HSV-2 infection  ? Relevant Medications  ? acyclovir (ZOVIRAX) 800 MG tablet  ? ?Other Visit Diagnoses   ? ? Routine general medical examination at a health care facility    -  Primary  ? Relevant Orders  ? IGP, Aptima HPV  ? Encounter for surveillance of contraceptive pills      ? Relevant Medications  ? Norgestimate-Ethinyl Estradiol Triphasic (TRI-SPRINTEC) 0.18/0.215/0.25 MG-35 MCG tablet  ? Screening examination for venereal disease      ? Relevant Orders  ? Chlamydia/Gonorrhea [place]  ? HIV [place] [facility]  ? WET PREP FOR TRICH, YEAST, CLUE  ? Syphilis Serology, [place] [facility]  ? ?  ? ? ?Return in about 1 year (around 05/24/2022) for annual well woman exam.  ? ? ?Evie Lacks, RN ? ?

## 2021-05-25 NOTE — Progress Notes (Signed)
See other note with same date.   Johnross Nabozny, FNP  

## 2021-05-26 ENCOUNTER — Telehealth: Payer: Self-pay | Admitting: Family Medicine

## 2021-05-26 NOTE — Telephone Encounter (Signed)
Pt has a question about the medication she is taking. ?

## 2021-05-31 ENCOUNTER — Ambulatory Visit: Payer: No Typology Code available for payment source

## 2021-05-31 LAB — IGP, APTIMA HPV
HPV Aptima: NEGATIVE
PAP Smear Comment: 0

## 2021-06-15 ENCOUNTER — Telehealth: Payer: Self-pay

## 2021-06-15 NOTE — Telephone Encounter (Signed)
Telephone call to patient regarding her PAP results.  Normal PAP and HPV Negative.  Colpo if she will, if not, repeat PAP in 1 year x 3 years. Left message for patient to return  my call at [phone].  Hart Carwin, RN

## 2021-06-16 NOTE — Telephone Encounter (Signed)
Return call by patient.  Reviewed PAP results with patient (PAP Normal and HPV Negative).  Discussed Colpo referral per Dr. Lyndel Safe, MD due to [facility] not being done in 2022 for her abnormal PAP (LSIL and + HPV).  Patient opted to do Colpo.  [facility] referral completed today and Joellyn Quails notified.  If [facility] changes the plan of care based off their guidelines, patient aware we could call her back.  Hart Carwin, RN

## 2021-06-30 ENCOUNTER — Telehealth: Payer: Self-pay

## 2021-06-30 NOTE — Telephone Encounter (Signed)
-----   Message from Cammy Copa sent at 06/30/2021  1:03 PM EDT ----- Regarding: RE: Colpo  referral Hi Keiko Myricks, per quideline pt will due for a one year follow up papsmear only. Thanks! ----- Message ----- From: Dahlia Bailiff, RN Sent: 06/30/2021  12:22 PM EDT To: Cammy Copa Subject: FW: Colpo  referral                            I received this message, but no message was in the body?  Thanks,  Jacqlyn Larsen ----- Message ----- From: Jeanella Anton D Sent: 06/29/2021   3:30 PM EDT To: Dahlia Bailiff, RN Subject: RE: Colpo  referral                             ----- Message ----- From: Dahlia Bailiff, RN Sent: 06/29/2021   3:17 PM EDT To: Cammy Copa Subject: Colpo  referral                                Christy,  Was this patient scheduled for a Colpo?  Her Pap 05-23-2021  was Normal and HPV Negative, but Dr. Ernestina Patches wanted her to f/u with a Colpo that she did not do for a LSIL and + HPV  in 2022.  Let me know what you guys think? I sent the referral over 06-16-2021.  Thanks,  [facility]

## 2021-06-30 NOTE — Telephone Encounter (Signed)
Telephone call to patient to make her aware of the guidelines to repeat her PAP in 1 year (04-2022) and [facility] would not be calling to schedule her Colpo.  Patient verbalizes understanding. Dahlia Bailiff, RN

## 2021-07-23 ENCOUNTER — Encounter: Payer: Self-pay | Admitting: Emergency Medicine

## 2021-07-23 ENCOUNTER — Other Ambulatory Visit: Payer: Self-pay

## 2021-07-23 ENCOUNTER — Ambulatory Visit
Admission: EM | Admit: 2021-07-23 | Discharge: 2021-07-23 | Disposition: A | Discharge status: Home / Self Care | Payer: No Typology Code available for payment source | Attending: Internal Medicine | Admitting: Internal Medicine

## 2021-07-23 DIAGNOSIS — L0291 Cutaneous abscess, unspecified: Secondary | ICD-10-CM

## 2021-07-23 MED ORDER — DOXYCYCLINE HYCLATE 100 MG PO CAPS: 100.0000 mg | ORAL_CAPSULE | Freq: Two times a day (BID) | ORAL | 0 refills | Status: DC

## 2021-07-23 NOTE — ED Triage Notes (Signed)
Pt c/o abscess on her inner left thigh. Started about 3 days ago. She states it has drained a  little but has became more red.

## 2021-12-25 ENCOUNTER — Telehealth: Payer: Self-pay | Admitting: Family Medicine

## 2021-12-25 NOTE — Telephone Encounter (Signed)
I am on my last pack of pills. In the past the nurse let me come by without an appointment and pick up a pack. Please call me to set that up.

## 2022-05-18 ENCOUNTER — Ambulatory Visit (LOCAL_COMMUNITY_HEALTH_CENTER): Payer: No Typology Code available for payment source

## 2022-05-18 VITALS — BP 135/86 | Ht 64.0 in | Wt 197.0 lb

## 2022-05-18 DIAGNOSIS — Z309 Encounter for contraceptive management, unspecified: Secondary | ICD-10-CM

## 2022-05-18 DIAGNOSIS — Z3041 Encounter for surveillance of contraceptive pills: Secondary | ICD-10-CM

## 2022-05-18 MED ORDER — NORGESTIM-ETH ESTRAD TRIPHASIC 0.18/0.215/0.25 MG-35 MCG PO TABS: 1.0000 | ORAL_TABLET | Freq: Every day | ORAL | 1 refills | Status: DC

## 2022-05-18 NOTE — Progress Notes (Signed)
In nurse clinic requesting ocp refill (Tri sprintec).  Pt explains she took last ocp 05/16/2022. Denies missing any other days of ocp. Admits to taking ocp same time each day. Per pt, She also took a Plan B yesterday because she ran out of the ocp. Last sex 05/08/2022. LMP 05/12/2022. [facility] [place]   RN counseled patient on when and how to use Plan B, such as within 120 hrs of unprotected sex and from what she explains to RN this morning, plan B would not have been indicated.   Consult Aliene Altes, FNP who orders pt to have Tri Sprintec #2 packs until her annual PE. Provider electronically orders Tri Sprintec to be sent to pt pharmacy- [facility].   RN explained that Tri Sprintec was E-Rx to her pharmacy. Condoms given today and should be used as back up as she starts this new pack and always for STD protection. Advised to schedule annual PE, due 05/25/2022. Pt plans to call for appt. Questions answered and reports understanding. Jerel Shepherd, RN

## 2022-05-22 ENCOUNTER — Ambulatory Visit
Admission: RE | Admit: 2022-05-22 | Discharge: 2022-05-22 | Disposition: A | Discharge status: Home / Self Care | Payer: No Typology Code available for payment source | Source: Ambulatory Visit | Attending: Emergency Medicine | Admitting: Emergency Medicine

## 2022-05-22 VITALS — BP 133/93 | HR 58 | Temp 98.3°F | Resp 18

## 2022-05-22 DIAGNOSIS — Z113 Encounter for screening for infections with a predominantly sexual mode of transmission: Secondary | ICD-10-CM

## 2022-05-22 NOTE — Discharge Instructions (Signed)
Labs pending ,you will be contacted if positive for any sti and treatment will be sent to the pharmacy, you will have to return to the clinic if positive for gonorrhea to receive treatment   Please refrain from having sex until labs results, if positive please refrain from having sex until treatment complete and symptoms resolve   If positive for HIV, Syphilis, Chlamydia  gonorrhea or trichomoniasis please notify partner or partners so they may tested as well  Moving forward, it is recommended you use some form of protection against the transmission of sti infections  such as condoms or dental dams with each sexual encounter   
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
## 2022-05-23 ENCOUNTER — Telehealth: Payer: Self-pay | Admitting: Internal Medicine

## 2022-05-23 LAB — CERVICOVAGINAL ANCILLARY ONLY
Bacterial Vaginitis (gardnerella): POSITIVE — AB
Candida Glabrata: NEGATIVE
Candida Vaginitis: NEGATIVE
Chlamydia: POSITIVE — AB
Comment: NEGATIVE
Comment: NEGATIVE
Comment: NEGATIVE
Comment: NEGATIVE
Comment: NEGATIVE
Comment: NORMAL
Neisseria Gonorrhea: NEGATIVE
Trichomonas: NEGATIVE

## 2022-05-23 LAB — HIV ANTIBODY (ROUTINE TESTING W REFLEX): HIV Screen 4th Generation wRfx: NONREACTIVE

## 2022-05-23 LAB — RPR: RPR Ser Ql: NONREACTIVE

## 2022-05-23 MED ORDER — DOXYCYCLINE HYCLATE 100 MG PO CAPS: 100.0000 mg | ORAL_CAPSULE | Freq: Two times a day (BID) | ORAL | 0 refills | Status: AC

## 2022-05-23 MED ORDER — METRONIDAZOLE 500 MG PO TABS
500.0000 mg | ORAL_TABLET | Freq: Two times a day (BID) | ORAL | 0 refills | Status: DC
Start: 2022-05-23 — End: 2022-07-24

## 2022-05-23 NOTE — Telephone Encounter (Signed)
Patient.  STD screen came back positive for chlamydia and bacterial vaginosis.  Doxycycline and Flagyl has been sent to the pharmacy.

## 2022-05-25 ENCOUNTER — Telehealth: Payer: Self-pay | Admitting: Emergency Medicine

## 2022-05-25 MED ORDER — ONDANSETRON 4 MG PO TBDP: 4.0000 mg | ORAL_TABLET | Freq: Three times a day (TID) | ORAL | 0 refills | Status: DC | PRN

## 2022-05-25 NOTE — ED Triage Notes (Signed)
Pt reports she missed a dose of flagyl that was given on 4/24. She states it made her vomit yesterday, and she is concerned whether or not missing a dose  will harm her.   Has tolerated the other antibiotics given.

## 2022-05-25 NOTE — Telephone Encounter (Signed)
Patient notified urgent care in person that after taking Flagyl, she began to have stomach upset causing her to vomit therefore missing dosage, was unsure if she is to continue taking medicine, Zofran prescribed and advised finishing course with notification of any further complications

## 2022-05-31 ENCOUNTER — Telehealth: Payer: Self-pay | Admitting: Family Medicine

## 2022-05-31 NOTE — Telephone Encounter (Signed)
Patient called wanting a nurse to contact her about some questions she had concerning women's health.

## 2022-06-07 ENCOUNTER — Other Ambulatory Visit: Payer: Self-pay | Admitting: Family Medicine

## 2022-06-07 DIAGNOSIS — Z3041 Encounter for surveillance of contraceptive pills: Secondary | ICD-10-CM

## 2022-06-11 ENCOUNTER — Other Ambulatory Visit: Payer: Self-pay | Admitting: Family Medicine

## 2022-06-11 DIAGNOSIS — Z3041 Encounter for surveillance of contraceptive pills: Secondary | ICD-10-CM

## 2022-06-11 MED ORDER — NORGESTIM-ETH ESTRAD TRIPHASIC 0.18/0.215/0.25 MG-35 MCG PO TABS
1.0000 | ORAL_TABLET | Freq: Every day | ORAL | 3 refills | Status: DC
Start: 2022-06-11 — End: 2022-11-08

## 2022-06-11 NOTE — Telephone Encounter (Signed)
Please give me a call regarding my Bc pills refill

## 2022-06-20 ENCOUNTER — Ambulatory Visit: Payer: No Typology Code available for payment source

## 2022-07-17 ENCOUNTER — Ambulatory Visit
Admission: EM | Admit: 2022-07-17 | Discharge: 2022-07-17 | Disposition: A | Discharge status: Home / Self Care | Payer: No Typology Code available for payment source | Attending: Nurse Practitioner | Admitting: Nurse Practitioner

## 2022-07-17 DIAGNOSIS — S0502XA Injury of conjunctiva and corneal abrasion without foreign body, left eye, initial encounter: Secondary | ICD-10-CM | POA: Diagnosis not present

## 2022-07-17 MED ORDER — OFLOXACIN 0.3 % OP SOLN: 1.0000 [drp] | Freq: Four times a day (QID) | OPHTHALMIC | 0 refills | Status: AC

## 2022-07-17 NOTE — Discharge Instructions (Signed)
Start ofloxacin antibiotic eyedrops as prescribed.  You may continue cool compresses to the outer part of the eye as needed Please follow-up with ophthalmology if your symptoms do not improve Please go to the emergency room if you develop any worsening symptoms I hope you feel better soon!
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
## 2022-07-17 NOTE — ED Provider Notes (Signed)
[facility]    CSN: [id number] Arrival date & time: 07/17/22  0817      History   Chief Complaint Chief Complaint  Patient presents with   Eye Injury    HPI Sonya Perez is a 32 y.o. female presents for evaluation of eye injury.  Patient states yesterday at her son's birthday party she was accidentally shot in her left eye with a gel water bead.  She has been applying cold compresses but endorses pain when opening her eye, foreign body sensation, watery drainage, redness.  States her vision can be slightly blurry.  She does not wear glasses or contacts.  No history of eye surgeries in the past.  No other injuries or concerns at this time.   Eye Injury    History reviewed. No pertinent past medical history.  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 No pertinent surgical history.  OB History     Gravida  1   Para  1   Term      Preterm      AB      Living  1      SAB      IAB      Ectopic      Multiple      Live Births               Home Medications    Prior to Admission medications   Medication Sig Start Date End Date Taking? Authorizing Provider  Norgestimate-Ethinyl Estradiol Triphasic (TRI-SPRINTEC) 0.18/0.215/0.25 MG-35 MCG tablet Take 1 tablet by mouth daily. 06/11/22 06/11/23 Yes Lenice Llamas, FNP  ofloxacin (OCUFLOX) 0.3 % ophthalmic solution Place 1 drop into the left eye 4 (four) times daily for 7 days. 07/17/22 07/24/22 Yes Radford Pax, NP  acyclovir (ZOVIRAX) 800 MG tablet Take 1 tablet (800 mg total) by mouth daily. 05/23/21   Wendi Snipes, FNP  metroNIDAZOLE (FLAGYL) 500 MG tablet Take 1 tablet (500 mg total) by mouth 2 (two) times daily. 05/23/22   Merrilee Jansky, MD  ondansetron (ZOFRAN-ODT) 4 MG disintegrating tablet  Take 1 tablet (4 mg total) by mouth every 8 (eight) hours as needed for nausea or vomiting. 05/25/22   Valinda Hoar, NP    Family History Family History  Problem Relation Age of Onset   Diabetes Maternal Grandmother    Breast cancer Neg Hx     Social History Social History   Tobacco Use   Smoking status: Former   Smokeless tobacco: Never  Building services engineer Use: Never used  Substance Use Topics   Alcohol use: Yes    Alcohol/week: 1.0 standard drink of alcohol    Types: 1 Glasses of wine per week    Comment: 1x/mo 1 glass of wine   Drug use: Never     Allergies   Patient has no known allergies.   Review of Systems Review of Systems  Eyes:  Positive for photophobia, pain, discharge and redness.     Physical Exam Triage Vital Signs ED Triage Vitals  Enc Vitals Group     BP 07/17/22 0829 138/89     Pulse Rate 07/17/22 0829 67     Resp 07/17/22 0829 16     Temp 07/17/22 0829 98.5 F (36.9  C)     Temp Source 07/17/22 0829 Oral     SpO2 07/17/22 0829 96 %     Weight 07/17/22 0828 185 lb (83.9 kg)     Height 07/17/22 0828 5\' 4"  (1.626 m)     Head Circumference --      Peak Flow --      Pain Score 07/17/22 0828 8     Pain Loc --      Pain Edu? --      Excl. in GC? --    No data found.  Updated Vital Signs BP 138/89 (BP Location: Right Arm)   Pulse 67   Temp 98.5 F (36.9 C) (Oral)   Resp 16   Ht 5\' 4"  (1.626 m)   Wt 185 lb (83.9 kg)   LMP 07/03/2022 (Approximate)   SpO2 96%   BMI 31.76 kg/m   Visual Acuity Right Eye Distance:   Left Eye Distance:   Bilateral Distance:    Right Eye Near:   Left Eye Near:    Bilateral Near:     Physical Exam Vitals and nursing note reviewed.  Constitutional:      General: She is not in acute distress.    Appearance: Normal appearance. She is not ill-appearing.  HENT:     Head: Normocephalic and atraumatic.  Eyes:     General:        Left eye: No foreign body or hordeolum.     Conjunctiva/sclera:      Left eye: Left conjunctiva is injected. No chemosis, exudate or hemorrhage.    Pupils:     Left eye: Pupil is round, reactive and not sluggish. Corneal abrasion and fluorescein uptake present.     Funduscopic exam:       Left eye: Red reflex present.     Comments: Mild swelling of upper and lower eyelid.  Watery drainage noted  Cardiovascular:     Rate and Rhythm: Normal rate.  Pulmonary:     Effort: Pulmonary effort is normal.  Skin:    General: Skin is warm and dry.  Neurological:     General: No focal deficit present.     Mental Status: She is alert and oriented to person, place, and time.  Psychiatric:        Mood and Affect: Mood normal.        Behavior: Behavior normal.      UC Treatments / Results  Labs (all labs ordered are listed, but only abnormal results are displayed) Labs Reviewed - No data to display  EKG   Radiology No results found.  Procedures Procedures (including critical care time)  Medications Ordered in UC Medications - No data to display  Initial Impression / Assessment and Plan / UC Course  I have reviewed the triage vital signs and the nursing notes.  Pertinent labs & imaging results that were available during my care of the patient were reviewed by me and considered in my medical decision making (see chart for details).      Reviewed exam and symptoms with patient.  Positive corneal abrasion.  Start ofloxacin eyedrops Continue cool compresses as needed Advised ophthalmology follow-up if symptoms do not improve ER precautions reviewed and patient verbalized understanding Final Clinical Impressions(s) / UC Diagnoses   Final diagnoses:  Abrasion of left cornea, initial encounter     Discharge Instructions      Start ofloxacin antibiotic eyedrops as prescribed.  You may continue cool compresses to the outer part of  the eye as needed Please follow-up with ophthalmology if your symptoms do not improve Please go to the emergency room  if you develop any worsening symptoms I hope you feel better soon!   ED Prescriptions     Medication Sig Dispense Auth. Provider   ofloxacin (OCUFLOX) 0.3 % ophthalmic solution Place 1 drop into the left eye 4 (four) times daily for 7 days. 5 mL Radford Pax, NP      PDMP not reviewed this encounter.   Radford Pax, NP 07/17/22 [phone]

## 2022-07-24 ENCOUNTER — Ambulatory Visit
Admission: EM | Admit: 2022-07-24 | Discharge: 2022-07-24 | Disposition: A | Discharge status: Home / Self Care | Payer: No Typology Code available for payment source | Attending: Emergency Medicine | Admitting: Emergency Medicine

## 2022-07-24 DIAGNOSIS — B9689 Other specified bacterial agents as the cause of diseases classified elsewhere: Secondary | ICD-10-CM | POA: Diagnosis present

## 2022-07-24 DIAGNOSIS — Z113 Encounter for screening for infections with a predominantly sexual mode of transmission: Secondary | ICD-10-CM | POA: Insufficient documentation

## 2022-07-24 DIAGNOSIS — N760 Acute vaginitis: Secondary | ICD-10-CM | POA: Diagnosis present

## 2022-07-24 LAB — WET PREP, GENITAL
Sperm: NONE SEEN
Trich, Wet Prep: NONE SEEN
WBC, Wet Prep HPF POC: >10 — AB (ref <10)
Yeast Wet Prep HPF POC: NONE SEEN

## 2022-07-24 MED ORDER — METRONIDAZOLE 500 MG PO TABS: 500.0000 mg | ORAL_TABLET | Freq: Two times a day (BID) | ORAL | 0 refills | Status: DC

## 2022-07-24 NOTE — Discharge Instructions (Addendum)
Take the Flagyl (metronidazole) 500 mg twice daily for treatment of your bacterial vaginosis.  Avoid alcohol while on the metronidazole as taken together will cause of vomiting.  Bacterial vaginosis is often caused by a imbalance of bacteria in your vaginal vault.  This is sometimes a result of using tampons or hormonal fluctuations during your menstrual cycle.  Your STI tests will be back tomorrow.  If you test positive for any STIs you will be contacted by phone and offered treatment options.  If your results are negative they will appear in your MyChart.  You if your symptoms are recurrent you can try using a boric acid suppository twice weekly to help maintain the acid-base balance in your vagina vault which could prevent further infection.  You can also try vaginal probiotics to help return normal bacterial balance.

## 2022-07-24 NOTE — ED Triage Notes (Signed)
Pt presents to UC for STD testing. Denies any active sx's.

## 2022-07-24 NOTE — ED Provider Notes (Signed)
[facility]    CSN: [id number] Arrival date & time: 07/24/22  0803      History   Chief Complaint Chief Complaint  Patient presents with   SEXUALLY TRANSMITTED DISEASE    HPI Sonya Perez is a 32 y.o. female.   HPI  32 year old female with a past medical history of anxiety and HSV-2 presents requesting STI testing.  2 months ago she contracted chlamydia from a new sexual partner and she is here to be retested to ensure that the infection has resolved.  She is no longer seeing the partner and has not had intercourse since she tested positive.  She is currently denying any symptoms such as vaginal discharge, itching, or pain.  Also no urinary symptoms such as dysuria, urgency, or frequency.  She was tested for HIV and syphilis at her last visit and she is declining testing at this time.  History reviewed. No pertinent past medical history.  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 No pertinent surgical history.  OB History     Gravida  1   Para  1   Term      Preterm      AB      Living  1      SAB      IAB      Ectopic      Multiple      Live Births               Home Medications    Prior to Admission medications   Medication Sig Start Date End Date Taking? Authorizing Provider  metroNIDAZOLE (FLAGYL) 500 MG tablet Take 1 tablet (500 mg total) by mouth 2 (two) times daily. 07/24/22  Yes Becky Augusta, NP  Norgestimate-Ethinyl Estradiol Triphasic (TRI-SPRINTEC) 0.18/0.215/0.25 MG-35 MCG tablet Take 1 tablet by mouth daily. 06/11/22 06/11/23 Yes Lenice Llamas, FNP  ofloxacin (OCUFLOX) 0.3 % ophthalmic solution Place 1 drop into the left eye 4 (four) times daily for 7 days. 07/17/22 07/24/22 Yes Radford Pax, NP    Family History Family History   Problem Relation Age of Onset   Diabetes Maternal Grandmother    Breast cancer Neg Hx     Social History Social History   Tobacco Use   Smoking status: Former   Smokeless tobacco: Never  Building services engineer Use: Never used  Substance Use Topics   Alcohol use: Yes    Alcohol/week: 1.0 standard drink of alcohol    Types: 1 Glasses of wine per week    Comment: 1x/mo 1 glass of wine   Drug use: Never     Allergies   Patient has no known allergies.   Review of Systems Review of Systems  Constitutional:  Negative for fever.  Genitourinary:  Negative for dysuria, frequency and urgency.  Skin:  Negative for rash.     Physical Exam Triage Vital Signs ED Triage Vitals  Enc Vitals Group     BP      Pulse      Resp      Temp      Temp src      SpO2      Weight      Height      Head Circumference  Peak Flow      Pain Score      Pain Loc      Pain Edu?      Excl. in GC?    No data found.  Updated Vital Signs BP 135/87 (BP Location: Right Arm)   Pulse 60   Temp 98.7 F (37.1 C) (Oral)   Resp 16   Ht 5\' 4"  (1.626 m)   Wt 185 lb (83.9 kg)   LMP 07/03/2022 (Approximate)   SpO2 98%   BMI 31.75 kg/m   Visual Acuity Right Eye Distance:   Left Eye Distance:   Bilateral Distance:    Right Eye Near:   Left Eye Near:    Bilateral Near:     Physical Exam Vitals and nursing note reviewed.  Constitutional:      Appearance: Normal appearance. She is not ill-appearing.  HENT:     Head: Normocephalic and atraumatic.  Skin:    General: Skin is warm and dry.     Capillary Refill: Capillary refill takes less than 2 seconds.     Findings: No lesion or rash.  Neurological:     General: No focal deficit present.     Mental Status: She is alert and oriented to person, place, and time.      UC Treatments / Results  Labs (all labs ordered are listed, but only abnormal results are displayed) Labs Reviewed  WET PREP, GENITAL - Abnormal; Notable for the  following components:      Result Value   Clue Cells Wet Prep HPF POC PRESENT (*)    WBC, Wet Prep HPF POC >10 (*)    All other components within normal limits  CERVICOVAGINAL ANCILLARY ONLY    EKG   Radiology No results found.  Procedures Procedures (including critical care time)  Medications Ordered in UC Medications - No data to display  Initial Impression / Assessment and Plan / UC Course  I have reviewed the triage vital signs and the nursing notes.  Pertinent labs & imaging results that were available during my care of the patient were reviewed by me and considered in my medical decision making (see chart for details).   Patient is a very pleasant, nontoxic-appearing 32 year old female presenting for repeat STI testing to ensure that her chlamydia infection has resolved.  She has not had intercourse since contracting chlamydia and she is no longer with the partner who she contracted it from.  As stated in HPI above, she is declining HIV and RPR at this time as she tested negative for both at her last visit.  I will order a vaginal cytology swab to look for the presence of gonorrhea and chlamydia and a vaginal wet prep to assess for trichomonas.  Was negative for trichomonas but is positive for clue cells.  I will discharge patient home with a diagnosis of bacterial vaginosis and treated with metronidazole 5 mg twice daily for 7 days.  Her STI testing will be back tomorrow and I have advised her that if she is positive for either gonorrhea or chlamydia she will receive a telephone call and be offered treatment.  If her results are negative they will appear in her MyChart.   Final Clinical Impressions(s) / UC Diagnoses   Final diagnoses:  BV (bacterial vaginosis)  Routine screening for STI (sexually transmitted infection)     Discharge Instructions      Take the Flagyl (metronidazole) 500 mg twice daily for treatment of your bacterial vaginosis.  Avoid  alcohol while  on the metronidazole as taken together will cause of vomiting.  Bacterial vaginosis is often caused by a imbalance of bacteria in your vaginal vault.  This is sometimes a result of using tampons or hormonal fluctuations during her menstrual cycle.  You if your symptoms are recurrent you can try using a boric acid suppository twice weekly to help maintain the acid-base balance in your vagina vault which could prevent further infection.  You can also try vaginal probiotics to help return normal bacterial balance.      ED Prescriptions     Medication Sig Dispense Auth. Provider   metroNIDAZOLE (FLAGYL) 500 MG tablet Take 1 tablet (500 mg total) by mouth 2 (two) times daily. 14 tablet Becky Augusta, NP      PDMP not reviewed this encounter.   Becky Augusta, NP 07/24/22 [phone]

## 2022-07-25 LAB — CERVICOVAGINAL ANCILLARY ONLY
Chlamydia: NEGATIVE
Comment: NEGATIVE
Comment: NORMAL
Neisseria Gonorrhea: NEGATIVE

## 2022-09-07 ENCOUNTER — Ambulatory Visit
Admission: EM | Admit: 2022-09-07 | Discharge: 2022-09-07 | Disposition: A | Discharge status: Home / Self Care | Payer: No Typology Code available for payment source | Attending: Emergency Medicine | Admitting: Emergency Medicine

## 2022-09-07 ENCOUNTER — Encounter: Payer: Self-pay | Admitting: Emergency Medicine

## 2022-09-07 DIAGNOSIS — L02214 Cutaneous abscess of groin: Secondary | ICD-10-CM

## 2022-09-07 MED ORDER — CEFDINIR 300 MG PO CAPS: 300.0000 mg | ORAL_CAPSULE | Freq: Two times a day (BID) | ORAL | 0 refills | Status: AC

## 2022-09-07 NOTE — ED Provider Notes (Signed)
[facility]    CSN: [id number] Arrival date & time: 09/07/22  0834      History   Chief Complaint Chief Complaint  Patient presents with   Abscess    HPI Sonya Perez is a 32 y.o. female.   HPI  32 year old female with a past medical history significant for migraines, anxiety, and a chest infection presents for evaluation of a red swollen, tender area in the left groin that she noticed 3 to 4 days ago.  She denies any fever or drainage.  She states that the area is tender to touch but it does not itch.  No blisters noted.  She reports that she does shave in that area.  History reviewed. No pertinent past medical history.  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 No pertinent surgical history.  OB History     Gravida  1   Para  1   Term      Preterm      AB      Living  1      SAB      IAB      Ectopic      Multiple      Live Births               Home Medications    Prior to Admission medications   Medication Sig Start Date End Date Taking? Authorizing Provider  cefdinir (OMNICEF) 300 MG capsule Take 1 capsule (300 mg total) by mouth 2 (two) times daily for 7 days. 09/07/22 09/14/22 Yes Becky Augusta, NP  Norgestimate-Ethinyl Estradiol Triphasic (TRI-SPRINTEC) 0.18/0.215/0.25 MG-35 MCG tablet Take 1 tablet by mouth daily. 06/11/22 06/11/23  Lenice Llamas, FNP    Family History Family History  Problem Relation Age of Onset   Diabetes Maternal Grandmother    Breast cancer Neg Hx     Social History Social History   Tobacco Use   Smoking status: Former   Smokeless tobacco: Never  Vaping Use   Vaping status: Never Used  Substance Use Topics   Alcohol use: Yes    Alcohol/week: 1.0 standard drink of alcohol    Types: 1 Glasses of wine  per week    Comment: 1x/mo 1 glass of wine   Drug use: Never     Allergies   Patient has no known allergies.   Review of Systems Review of Systems  Constitutional:  Negative for fever.  Skin:  Positive for color change. Negative for wound.     Physical Exam Triage Vital Signs ED Triage Vitals [09/07/22 0902]  Encounter Vitals Group     BP      Systolic BP Percentile      Diastolic BP Percentile      Pulse      Resp      Temp      Temp src      SpO2      Weight 184 lb 15.5 oz (83.9 kg)     Height 5\' 4"  (1.626 m)     Head Circumference      Peak Flow      Pain Score 9     Pain Loc      Pain Education      Exclude from Growth Chart  No data found.  Updated Vital Signs BP 139/89 (BP Location: Right Arm)   Pulse 87   Temp 99.3 F (37.4 C) (Oral)   Resp 14   Ht 5\' 4"  (1.626 m)   Wt 184 lb 15.5 oz (83.9 kg)   LMP 08/31/2022 (Approximate)   SpO2 98%   BMI 31.75 kg/m   Visual Acuity Right Eye Distance:   Left Eye Distance:   Bilateral Distance:    Right Eye Near:   Left Eye Near:    Bilateral Near:     Physical Exam Vitals and nursing note reviewed.  Constitutional:      Appearance: Normal appearance. She is not ill-appearing.  HENT:     Head: Normocephalic and atraumatic.  Skin:    General: Skin is warm and dry.     Capillary Refill: Capillary refill takes less than 2 seconds.     Findings: Erythema and lesion present.  Neurological:     General: No focal deficit present.     Mental Status: She is alert and oriented to person, place, and time.      UC Treatments / Results  Labs (all labs ordered are listed, but only abnormal results are displayed) Labs Reviewed - No data to display  EKG   Radiology No results found.  Procedures Procedures (including critical care time)  Medications Ordered in UC Medications - No data to display  Initial Impression / Assessment and Plan / UC Course  I have reviewed the triage vital signs and  the nursing notes.  Pertinent labs & imaging results that were available during my care of the patient were reviewed by me and considered in my medical decision making (see chart for details).   Patient is a very pleasant, nontoxic appearing 32 year old female presenting for evaluation of possible skin abscess in the left groin that she noticed 3 to 4 days ago as outlined HPI above.  On exam she does have an area approximately size of a kidney bean in the left groin that is erythematous, indurated, and tender to touch.  There is a smaller, flat, erythematous tender lesion inferior to the primary lesion.  This lesion is suspicious for immature abscess.  I will start the patient on cefdinir 300 mg twice daily for 7 days for treatment of the immature abscess along with having her apply warm compresses to the area to see if she can get it to come to ahead and rupture on its own.  I have advised her that if the area becomes more red, swollen, or spongy that she should return for reevaluation and possible incision and drainage.  Work note provided.   Final Clinical Impressions(s) / UC Diagnoses   Final diagnoses:  Abscess of left groin     Discharge Instructions      Take the cefdinir twice daily with food for 7 days for treatment of your groin abscess.  Take an over-the-counter probiotic such as Culturelle or align 1 hour after each dose of antibiotic to help prevent diarrhea that can come from antibiotic usage.  Apply warm compresses to the lesion in your left groin to see if you can get it to come to ahead and express on its own.  If you have any increase in redness, swelling, tenderness, the lesion becomes spongy, or you develop a fever you need to be reevaluated either in urgent care or in the ER.     ED Prescriptions     Medication Sig Dispense Auth. Provider  cefdinir (OMNICEF) 300 MG capsule Take 1 capsule (300 mg total) by mouth 2 (two) times daily for 7 days. 14 capsule Becky Augusta, NP      PDMP not reviewed this encounter.   Becky Augusta, NP 09/07/22 [phone]

## 2022-09-07 NOTE — Discharge Instructions (Addendum)
Take the cefdinir twice daily with food for 7 days for treatment of your groin abscess.  Take an over-the-counter probiotic such as Culturelle or align 1 hour after each dose of antibiotic to help prevent diarrhea that can come from antibiotic usage.  Apply warm compresses to the lesion in your left groin to see if you can get it to come to ahead and express on its own.  If you have any increase in redness, swelling, tenderness, the lesion becomes spongy, or you develop a fever you need to be reevaluated either in urgent care or in the ER.

## 2022-09-07 NOTE — ED Triage Notes (Signed)
Patient c/o red swollen tender bumps on her left groin area for the past 3-4 days.  Patient states that she does shave in that area.

## 2022-10-19 ENCOUNTER — Ambulatory Visit
Admission: EM | Admit: 2022-10-19 | Discharge: 2022-10-19 | Disposition: A | Discharge status: Home / Self Care | Payer: No Typology Code available for payment source | Attending: Emergency Medicine | Admitting: Emergency Medicine

## 2022-10-19 ENCOUNTER — Encounter: Payer: Self-pay | Admitting: Emergency Medicine

## 2022-10-19 DIAGNOSIS — L02411 Cutaneous abscess of right axilla: Secondary | ICD-10-CM

## 2022-10-19 MED ORDER — CEFDINIR 300 MG PO CAPS: 300.0000 mg | ORAL_CAPSULE | Freq: Two times a day (BID) | ORAL | 0 refills | Status: AC

## 2022-10-19 MED ORDER — TRAMADOL HCL 50 MG PO TABS: 50.0000 mg | ORAL_TABLET | Freq: Four times a day (QID) | ORAL | 0 refills | Status: DC | PRN

## 2022-10-19 NOTE — Discharge Instructions (Addendum)
You have been diagnosed with an abscess in your right axilla.  This is most likely secondary to an ingrown hair as result of shaving.  There is a small whitehead that is forming around a hair follicle.  You may be able to remove this ingrown hair with a pair tweezers at home and the lesion may drain.  Continue to apply warm compresses to the area to see if you can get the lesion to come more to ahead and drain on its own.  Take the cefdinir 300 mg twice daily with food for 7 days for treatment of your abscess.  Use over-the-counter Tylenol and/or ibuprofen according to package instructions as needed for mild to moderate pain.  You may take the tramadol as needed for severe pain.  Be mindful of this medication will sedate you so do not drink alcohol or drive if you take it.  Also do not operate machinery.  If the area becomes more swollen, red, soft, or you start running fevers please return for reevaluation or see your PCP.

## 2022-10-19 NOTE — ED Provider Notes (Signed)
[facility]    CSN: [id number] Arrival date & time: 10/19/22  0813      History   Chief Complaint Chief Complaint  Patient presents with   Abscess    HPI Sonya Perez is a 32 y.o. female.   HPI  32 year old female with a past medical history significant for migraines, HSV-2, and recurrent abscesses presents for evaluation of an abscess in her right axilla.  She reports that she first noticed it a week ago and states that it has not been draining.  She also denies fevers.  She has had similar abscesses in her left axilla and left groin that were secondary to ingrown hairs as a result of shaving.  She feels that this is the same cause of her current abscess.  She has been applying warm compresses without any improvement.  History reviewed. No pertinent past medical history.  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 No pertinent surgical history.  OB History     Gravida  1   Para  1   Term      Preterm      AB      Living  1      SAB      IAB      Ectopic      Multiple      Live Births               Home Medications    Prior to Admission medications   Medication Sig Start Date End Date Taking? Authorizing Provider  cefdinir (OMNICEF) 300 MG capsule Take 1 capsule (300 mg total) by mouth 2 (two) times daily for 7 days. 10/19/22 10/26/22 Yes Becky Augusta, NP  traMADol (ULTRAM) 50 MG tablet Take 1 tablet (50 mg total) by mouth every 6 (six) hours as needed. 10/19/22  Yes Becky Augusta, NP  Norgestimate-Ethinyl Estradiol Triphasic (TRI-SPRINTEC) 0.18/0.215/0.25 MG-35 MCG tablet Take 1 tablet by mouth daily. 06/11/22 06/11/23  Lenice Llamas, FNP    Family History Family History  Problem Relation Age of Onset   Diabetes Maternal Grandmother     Breast cancer Neg Hx     Social History Social History   Tobacco Use   Smoking status: Former   Smokeless tobacco: Never  Vaping Use   Vaping status: Never Used  Substance Use Topics   Alcohol use: Yes    Alcohol/week: 1.0 standard drink of alcohol    Types: 1 Glasses of wine per week    Comment: 1x/mo 1 glass of wine   Drug use: Never     Allergies   Patient has no known allergies.   Review of Systems Review of Systems  Constitutional:  Negative for fever.  Skin:  Negative for color change.       Tender, swollen, painful area in right axilla.     Physical Exam Triage Vital Signs ED Triage Vitals [10/19/22 0814]  Encounter Vitals Group     BP      Systolic BP Percentile      Diastolic BP Percentile      Pulse      Resp      Temp      Temp src      SpO2      Weight 184 lb  15.5 oz (83.9 kg)     Height 5\' 4"  (1.626 m)     Head Circumference      Peak Flow      Pain Score 10     Pain Loc      Pain Education      Exclude from Growth Chart    No data found.  Updated Vital Signs BP 116/79 (BP Location: Right Arm)   Pulse 66   Temp 98.1 F (36.7 C) (Oral)   Resp 14   Ht 5\' 4"  (1.626 m)   Wt 184 lb 15.5 oz (83.9 kg)   LMP 09/22/2022 (Approximate)   SpO2 97%   BMI 31.75 kg/m   Visual Acuity Right Eye Distance:   Left Eye Distance:   Bilateral Distance:    Right Eye Near:   Left Eye Near:    Bilateral Near:     Physical Exam Vitals and nursing note reviewed.  Constitutional:      Appearance: Normal appearance. She is not ill-appearing.  HENT:     Head: Normocephalic and atraumatic.  Skin:    General: Skin is warm and dry.     Capillary Refill: Capillary refill takes less than 2 seconds.     Findings: Erythema and lesion present.  Neurological:     General: No focal deficit present.     Mental Status: She is alert and oriented to person, place, and time.      UC Treatments / Results  Labs (all labs ordered are listed, but only  abnormal results are displayed) Labs Reviewed - No data to display  EKG   Radiology No results found.  Procedures Procedures (including critical care time)  Medications Ordered in UC Medications - No data to display  Initial Impression / Assessment and Plan / UC Course  I have reviewed the triage vital signs and the nursing notes.  Pertinent labs & imaging results that were available during my care of the patient were reviewed by me and considered in my medical decision making (see chart for details).   Patient is a pleasant, nontoxic-appearing 32 year old female presenting for evaluation of abscess in her right axilla that has been there for the past week.  As you can see in image above, there is an erythematous area approximately size of a [employer] and the right axilla.  There does appear to be a head forming but the lesion itself is hard, tender, warm, and indurated.  No fluctuance noted.  I suspect that this is also a result of an ingrown hair from shaving.  I will encourage patient to continue to apply warm compresses and I will start her on cefdinir 300 mg twice daily for 7 days.  She can use Tylenol and or ibuprofen as needed for mild to moderate pain and I will give her a short prescription of tramadol for severe pain given the marked tenderness to palpation.  Patient has no open prescription in PDMP.  Final Clinical Impressions(s) / UC Diagnoses   Final diagnoses:  Abscess of axilla, right     Discharge Instructions      You have been diagnosed with an abscess in your right axilla.  This is most likely secondary to an ingrown hair as result of shaving.  There is a small whitehead that is forming around a hair follicle.  You may be able to remove this ingrown hair with a pair tweezers at home and the lesion may drain.  Continue to apply warm compresses to  the area to see if you can get the lesion to come more to ahead and drain on its own.  Take the cefdinir 300 mg  twice daily with food for 7 days for treatment of your abscess.  Use over-the-counter Tylenol and/or ibuprofen according to package instructions as needed for mild to moderate pain.  You may take the tramadol as needed for severe pain.  Be mindful of this medication will sedate you so do not drink alcohol or drive if you take it.  Also do not operate machinery.  If the area becomes more swollen, red, soft, or you start running fevers please return for reevaluation or see your PCP.     ED Prescriptions     Medication Sig Dispense Auth. Provider   cefdinir (OMNICEF) 300 MG capsule Take 1 capsule (300 mg total) by mouth 2 (two) times daily for 7 days. 14 capsule Becky Augusta, NP   traMADol (ULTRAM) 50 MG tablet Take 1 tablet (50 mg total) by mouth every 6 (six) hours as needed. 15 tablet Becky Augusta, NP      I have reviewed the PDMP during this encounter.   Becky Augusta, NP 10/19/22 [phone]

## 2022-10-19 NOTE — ED Triage Notes (Signed)
Patient states that she has had an abscess in her right armpit for a week.  Patient denies drainage.  Patient denies fevers.

## 2022-11-08 ENCOUNTER — Encounter: Payer: Self-pay | Admitting: Family Medicine

## 2022-11-08 ENCOUNTER — Other Ambulatory Visit: Payer: Self-pay

## 2022-11-08 ENCOUNTER — Ambulatory Visit: Payer: No Typology Code available for payment source | Admitting: Family Medicine

## 2022-11-08 VITALS — BP 134/88 | HR 65 | Wt 196.4 lb

## 2022-11-08 DIAGNOSIS — Z3009 Encounter for other general counseling and advice on contraception: Secondary | ICD-10-CM

## 2022-11-08 DIAGNOSIS — R87612 Low grade squamous intraepithelial lesion on cytologic smear of cervix (LGSIL): Secondary | ICD-10-CM

## 2022-11-08 DIAGNOSIS — B009 Herpesviral infection, unspecified: Secondary | ICD-10-CM

## 2022-11-08 DIAGNOSIS — Z30011 Encounter for initial prescription of contraceptive pills: Secondary | ICD-10-CM | POA: Diagnosis not present

## 2022-11-08 DIAGNOSIS — Z01419 Encounter for gynecological examination (general) (routine) without abnormal findings: Secondary | ICD-10-CM | POA: Diagnosis not present

## 2022-11-08 DIAGNOSIS — Z3041 Encounter for surveillance of contraceptive pills: Secondary | ICD-10-CM

## 2022-11-08 DIAGNOSIS — Z113 Encounter for screening for infections with a predominantly sexual mode of transmission: Secondary | ICD-10-CM

## 2022-11-08 LAB — WET PREP FOR TRICH, YEAST, CLUE
Trichomonas Exam: NEGATIVE
Yeast Exam: NEGATIVE

## 2022-11-08 MED ORDER — NORGESTIM-ETH ESTRAD TRIPHASIC 0.18/0.215/0.25 MG-35 MCG PO TABS
1.0000 | ORAL_TABLET | Freq: Every day | ORAL | 3 refills | Status: DC
Start: 2022-11-08 — End: 2023-10-23

## 2022-11-08 MED ORDER — ACYCLOVIR 800 MG PO TABS
800.0000 mg | ORAL_TABLET | Freq: Every day | ORAL | 12 refills | Status: DC
Start: 2022-11-08 — End: 2023-10-30

## 2022-11-08 NOTE — Progress Notes (Signed)
Pt is here for FP visit, packet reviewed and given to pt.  Wet prep results reviewed, no treatment per standing order. Condoms declined. Gaspar Garbe, RN

## 2022-11-08 NOTE — Progress Notes (Signed)

## 2022-11-08 NOTE — Progress Notes (Signed)
[facility] DEPARTMENT [facility] [address] Main Number: [phone]  Family Planning Visit- Repeat Yearly Visit  Subjective:  Sonya Perez is a pleasant 32 y.o. G1P1001  being seen today for an annual wellness visit and to discuss contraception options.   The patient is currently using Oral Contraceptive for pregnancy prevention. Patient does not want a pregnancy in the next year.   she/her/hers report they are looking for a method that provides High efficacy at preventing pregnancy   Patient has the following medical problems: has Anxiety; Obesity, unspecified; HSV-2 infection; History of migraine; and Abnormal Pap smear of cervix on their problem list.  Chief Complaint  Patient presents with   Annual Exam    PE   Patient reports no concerns today and is requesting a refill of her oral contraceptive and HSV2 suppressive therapy antiviral. She reports no outbreaks recently and the last outbreak about 8 months to 1 year ago. She is taking Acyclovir daily and has no concerns with side effects. She is wanting to continue suppressive therapy. Patient also reports compliance with her oral contraceptive, with no side effects, and wanting to continue that.  Patient reports last sexual encounter was 11/06/22 without a condom. She reports 1 female partner in the last year. She has oral and vaginal sex. Patient has history of trichomoniasis, chlamydia, and HSV2.  PAP history is extensive and includes LSIL X 2, with positive HPV, and Colpo showing CIN1. Per ASSCP guidelines, patient should repeat PAP in one year from previous testing, which would have been on or around 05/24/22. Since this is overdue, it PAP with HPV co-testing was performed in office today.   See flowsheet for other program required questions.   Body mass index is 33.71 kg/m. - Patient is eligible for diabetes screening based on BMI> 25 and age >35?  not applicable HA1C ordered? not  applicable  Patient reports 1 of partners in last year. Desires STI screening?  Yes   Has patient been screened once for HCV in the past?  No  No results found for: "HCVAB"  Does the patient have current of drug use, have a partner with drug use, and/or has been incarcerated since last result? No  If yes-- Screen for HCV through [facility]   Does the patient meet criteria for HBV testing? No  Criteria:  -Household, sexual or needle sharing contact with HBV -History of drug use -HIV positive -Those with known Hep C   Health Maintenance Due  Topic Date Due   Hepatitis C Screening  Never done   Cervical Cancer Screening (Pap smear)  05/24/2022   INFLUENZA VACCINE  Never done   COVID-19 Vaccine (1 - 2023-24 season) Never done    Review of Systems  Constitutional: Negative.   HENT: Negative.    Eyes: Negative.   Respiratory: Negative.    Cardiovascular: Negative.   Gastrointestinal: Negative.   Genitourinary: Negative.   Musculoskeletal: Negative.   Skin: Negative.   Neurological: Negative.   Endo/Heme/Allergies: Negative.   Psychiatric/Behavioral: Negative.      The following portions of the patient's history were reviewed and updated as appropriate: allergies, current medications, past family history, past medical history, past social history, past surgical history and problem list. Problem list updated.  Objective:   Vitals:   11/08/22 1010  BP: 134/88  Pulse: 65  Weight: 196 lb 6.4 oz (89.1 kg)    Physical Exam Vitals and nursing note reviewed. Exam conducted with  a chaperone present Cameron Proud, RN in room as chaperone for PE.).  Constitutional:      Appearance: Normal appearance.  HENT:     Head: Normocephalic.     Salivary Glands: Right salivary gland is not diffusely enlarged or tender. Left salivary gland is not diffusely enlarged or tender.     Mouth/Throat:     Lips: Pink. No lesions.     Mouth: Mucous membranes are moist.     Tongue: No  lesions. Tongue does not deviate from midline.     Pharynx: Oropharynx is clear. Uvula midline. No oropharyngeal exudate or posterior oropharyngeal erythema.     Tonsils: No tonsillar exudate.  Eyes:     General:        Right eye: No discharge.        Left eye: No discharge.  Pulmonary:     Effort: Pulmonary effort is normal.  Genitourinary:    General: Normal vulva.     Exam position: Lithotomy position.     Pubic Area: No rash or pubic lice.      Tanner stage (genital): 5.     Labia:        Right: No rash, tenderness, lesion or injury.        Left: No rash, tenderness, lesion or injury.      Vagina: No vaginal discharge.  Lymphadenopathy:     Head:     Right side of head: No submental, submandibular, tonsillar, preauricular or posterior auricular adenopathy.     Left side of head: No submental, submandibular, tonsillar, preauricular or posterior auricular adenopathy.     Cervical:     Right cervical: No superficial or posterior cervical adenopathy.    Left cervical: No superficial or posterior cervical adenopathy.     Upper Body:     Right upper body: No supraclavicular or axillary adenopathy.     Left upper body: No supraclavicular or axillary adenopathy.     Lower Body: No right inguinal adenopathy. No left inguinal adenopathy.  Skin:    General: Skin is warm and dry.  Neurological:     Mental Status: She is alert and oriented to person, place, and time.  Psychiatric:        Attention and Perception: Attention normal.        Mood and Affect: Mood normal.        Speech: Speech normal.        Behavior: Behavior normal. Behavior is cooperative.        Thought Content: Thought content normal.    Assessment and Plan:  Sonya Perez is a 32 y.o. female G1P1001 presenting to the [facility] Department for an yearly wellness and contraception visit   Contraception counseling: Reviewed options based on patient desire and reproductive life plan. Patient is  interested in Oral Contraceptive. This was provided to the patient today.   Risks, benefits, and typical effectiveness rates were reviewed.  Questions were answered.  Written information was also given to the patient to review.    The patient will follow up in  1 years for surveillance.  The patient was told to call with any further questions, or with any concerns about this method of contraception.  Emphasized use of condoms 100% of the time for STI prevention.  Educated on ECP and assessed need for ECP. Patient was NOT offered ECP as she is currently taking oral contraception.  1. Well woman exam Please see patient's cytology history within care gaps. Extensive  history including HPV (+), LSIL, and Colpo positive for CIN1. PAP with co-testing was last due in April 2024 and not performed. This was performed in office today.  - IGP, Aptima HPV  2. Family planning Patient has been on Tri-Sprintec and happy with this method of contraception. She reports no ADE and wishes to continue this method. Refill provided today.  - Norgestimate-Ethinyl Estradiol Triphasic (TRI-SPRINTEC) 0.18/0.215/0.25 MG-35 MCG tablet; Take 1 tablet by mouth daily.  Dispense: 84 tablet; Refill: 3  3. Screening for venereal disease  - Chlamydia/Gonorrhea  Lab - WET PREP FOR TRICH, YEAST, CLUE  4. Encounter for surveillance of contraceptive pills   5. HSV-2 infection Patient has been taking Acyclovir 800mg  daily as suppressive therapy for over a year and it is time for a refill. Patient reports being satisfied with medication, no ADE, and last outbreak about 8 months to 1 year ago. Prescription refill provided for patient today.  - acyclovir (ZOVIRAX) 800 MG tablet; Take 1 tablet (800 mg total) by mouth daily.  Dispense: 30 tablet; Refill: 12  Return in about 6 months (around 05/24/2023) for PAP with HPV co-testing per ASCCP based on patient history. .  No future appointments.  Edmonia James, NP

## 2022-11-14 LAB — IGP, APTIMA HPV
HPV Aptima: NEGATIVE
PAP Smear Comment: 0

## 2022-11-15 NOTE — Progress Notes (Signed)
Please send letter to patient to repeat PAP with HPV in 3 years. NILM, HPV (-) according to ASCCP guidelines patient should have repeat co-testing with cytology and HPV in 3 years due to prior abnormal history.

## 2022-12-18 ENCOUNTER — Encounter: Payer: Self-pay | Admitting: Emergency Medicine

## 2022-12-18 ENCOUNTER — Ambulatory Visit
Admission: EM | Admit: 2022-12-18 | Discharge: 2022-12-18 | Disposition: A | Discharge status: Home / Self Care | Payer: No Typology Code available for payment source

## 2022-12-18 DIAGNOSIS — H00012 Hordeolum externum right lower eyelid: Secondary | ICD-10-CM

## 2022-12-18 NOTE — ED Triage Notes (Signed)
Pt has a virtual visit on 11/15 for right eye lid irritation after trying new eye lashes. She was prescribed prednisone and erythromycin eye drops and is not better.

## 2022-12-18 NOTE — ED Provider Notes (Signed)
[facility]    CSN: [id number] Arrival date & time: 12/18/22  1803      History   Chief Complaint Chief Complaint  Patient presents with   Eye Irritation     HPI Sonya Perez is a 32 y.o. female.   32 year old female pt, Sonya Perez, presents to urgent care for evaluation of right lower outer eyelid.  Pt has virtual visit on 11/15 and was prescribed prednisone and erythromycin ointment.  The history is provided by the patient. No language interpreter was used.    History reviewed. No pertinent past medical history.  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 No pertinent surgical history.  OB History     Gravida  1   Para  1   Term  1   Preterm      AB      Living  1      SAB      IAB      Ectopic      Multiple      Live Births               Home Medications    Prior to Admission medications   Medication Sig Start Date End Date Taking? Authorizing Provider  erythromycin ophthalmic ointment Administer to the right eye Three (3) times a day for 10 days. 12/16/22 12/26/22 Yes [provider]  predniSONE (DELTASONE) 20 MG tablet Take by mouth. 12/16/22 12/21/22 Yes [provider]  acyclovir (ZOVIRAX) 800 MG tablet Take 1 tablet (800 mg total) by mouth daily. 11/08/22   Edmonia James, NP  Norgestimate-Ethinyl Estradiol Triphasic (TRI-SPRINTEC) 0.18/0.215/0.25 MG-35 MCG tablet Take 1 tablet by mouth daily. 11/08/22 11/08/23  Edmonia James, NP  traMADol (ULTRAM) 50 MG tablet Take 1 tablet (50 mg total) by mouth every 6 (six) hours as needed. Patient not taking: Reported on 11/08/2022 10/19/22   Becky Augusta, NP    Family History Family History  Problem Relation Age of  Onset   Healthy Mother    Healthy Father    Diabetes Maternal Grandmother    Healthy Maternal Grandfather    Breast cancer Neg Hx     Social History Social History   Tobacco Use   Smoking status: Former    Types: Cigarettes   Smokeless tobacco: Never  Vaping Use   Vaping status: Never Used  Substance Use Topics   Alcohol use: Yes    Alcohol/week: 1.0 standard drink of alcohol    Types: 1 Glasses of wine per week    Comment: 1x week 1 glass of wine   Drug use: Never     Allergies   Patient has no known allergies.   Review of Systems Review of Systems  Eyes:  Positive for pain.       Right lower eyelid swelling  All other systems reviewed and are negative.    Physical Exam Triage Vital Signs ED Triage Vitals [12/18/22 1820]  Encounter Vitals Group     BP 124/86     Systolic BP Percentile      Diastolic BP Percentile      Pulse Rate 71     Resp 16     Temp 97.7  F (36.5 C)     Temp Source Oral     SpO2 99 %     Weight      Height      Head Circumference      Peak Flow      Pain Score 0     Pain Loc      Pain Education      Exclude from Growth Chart    No data found.  Updated Vital Signs BP 124/86 (BP Location: Right Arm)   Pulse 71   Temp 97.7 F (36.5 C) (Oral)   Resp 16   LMP 11/21/2022   SpO2 99%   Visual Acuity Right Eye Distance:   Left Eye Distance:   Bilateral Distance:    Right Eye Near:   Left Eye Near:    Bilateral Near:     Physical Exam Vitals and nursing note reviewed.  Constitutional:      Appearance: She is well-developed and well-groomed.  Eyes:     General: Vision grossly intact.        Right eye: Hordeolum present.     Extraocular Movements: Extraocular movements intact.     Conjunctiva/sclera:     Right eye: Right conjunctiva is not injected.     Left eye: Left conjunctiva is not injected.   Cardiovascular:     Rate and Rhythm: Normal rate.  Pulmonary:     Effort: Pulmonary effort is normal.     Breath  sounds: Normal breath sounds and air entry.  Neurological:     General: No focal deficit present.     Mental Status: She is alert and oriented to person, place, and time.     GCS: GCS eye subscore is 4. GCS verbal subscore is 5. GCS motor subscore is 6.     Cranial Nerves: No cranial nerve deficit.     Sensory: No sensory deficit.  Psychiatric:        Attention and Perception: Attention normal.        Mood and Affect: Mood normal.        Speech: Speech normal.        Behavior: Behavior normal. Behavior is cooperative.      UC Treatments / Results  Labs (all labs ordered are listed, but only abnormal results are displayed) Labs Reviewed - No data to display  EKG   Radiology No results found.  Procedures Procedures (including critical care time)  Medications Ordered in UC Medications - No data to display  Initial Impression / Assessment and Plan / UC Perez  I have reviewed the triage vital signs and the nursing notes.  Pertinent labs & imaging results that were available during my care of the patient were reviewed by me and considered in my medical decision making (see chart for details).    Discussed exam findings and plan of care with patient, finished prednisone and erythromycin ointment as directed , make sure to wash her hands frequently , ice to face as we discussed ,strict go to ER precautions given.   Patient verbalized understanding to this provider.  Ddx: Stye(right), corneal abrasion Final Clinical Impressions(s) / UC Diagnoses   Final diagnoses:  Hordeolum externum of right lower eyelid     Discharge Instructions      Apply ice pack to face,no heat as it will make it swell. Wash hands well before and after applying eye medication. Follow up with eye doctor in 2 days for recheck,sooner if worse.  If you have  loss of vision or worsening symptoms go to emergency room for further evaluation.     ED Prescriptions   None    PDMP not reviewed this  encounter.   Clancy Gourd, NP 12/18/22 2043

## 2022-12-18 NOTE — Discharge Instructions (Addendum)
Apply ice pack to face,no heat as it will make it swell. Wash hands well before and after applying eye medication. Follow up with eye doctor in 2 days for recheck,sooner if worse.  If you have loss of vision or worsening symptoms go to emergency room for further evaluation.

## 2023-01-29 ENCOUNTER — Ambulatory Visit
Admission: EM | Admit: 2023-01-29 | Discharge: 2023-01-29 | Disposition: A | Discharge status: Home / Self Care | Payer: No Typology Code available for payment source | Attending: Emergency Medicine | Admitting: Emergency Medicine

## 2023-01-29 DIAGNOSIS — L0291 Cutaneous abscess, unspecified: Secondary | ICD-10-CM | POA: Insufficient documentation

## 2023-01-29 DIAGNOSIS — L03115 Cellulitis of right lower limb: Secondary | ICD-10-CM | POA: Diagnosis present

## 2023-01-29 MED ORDER — DOXYCYCLINE HYCLATE 100 MG PO CAPS: 100.0000 mg | ORAL_CAPSULE | Freq: Two times a day (BID) | ORAL | 0 refills | Status: AC

## 2023-01-29 MED ORDER — CEPHALEXIN 500 MG PO CAPS: 1000.0000 mg | ORAL_CAPSULE | Freq: Two times a day (BID) | ORAL | 0 refills | Status: AC

## 2023-01-29 MED ORDER — NAPROXEN 500 MG PO TABS: 500.0000 mg | ORAL_TABLET | Freq: Two times a day (BID) | ORAL | 0 refills | Status: DC

## 2023-01-29 MED ORDER — CHLORHEXIDINE GLUCONATE 4 % EX SOLN: Freq: Every day | CUTANEOUS | 0 refills | Status: DC | PRN

## 2023-01-29 NOTE — ED Provider Notes (Signed)
 HPI  SUBJECTIVE:  Sonya Perez is a 32 y.o. female who presents with a painful erythematous mass of gradually increasing size on her anterior right lower extremity after attempting to pop a bump 3 days ago.  She states that she got some pus out, squeezed again, and then this was followed by pain.  She describes his pain as deep, throbbing, tightness and reports localized swelling, erythema.  No further drainage.  No fevers, body aches.  No recent antibiotics.  She tried squeezing it, Advil, elevation, warm and cold compresses, and a topical boil product.  The Advil helps.  Last dose was within 6 hours of evaluation.  Symptoms are worse with walking, palpation.  She has a past medical history of abscesses.  No history of MRSA, diabetes, HIV.  LMP: 12/24.  Denies possibility of being pregnant.  PCP: None.   History reviewed. No pertinent past medical history.  History reviewed. No pertinent surgical history.  Family History  Problem Relation Age of Onset   Healthy Mother    Healthy Father    Diabetes Maternal Grandmother    Healthy Maternal Grandfather    Breast cancer Neg Hx     Social History   Tobacco Use   Smoking status: Former    Types: Cigarettes   Smokeless tobacco: Never  Vaping Use   Vaping status: Never Used  Substance Use Topics   Alcohol use: Yes    Alcohol/week: 1.0 standard drink of alcohol    Types: 1 Glasses of wine per week    Comment: 1x week 1 glass of wine   Drug use: Never    No current facility-administered medications for this encounter.  Current Outpatient Medications:    cephALEXin  (KEFLEX ) 500 MG capsule, Take 2 capsules (1,000 mg total) by mouth 2 (two) times daily for 5 days., Disp: 20 capsule, Rfl: 0   chlorhexidine  (HIBICLENS ) 4 % external liquid, Apply topically daily as needed., Disp: 120 mL, Rfl: 0   doxycycline  (VIBRAMYCIN ) 100 MG capsule, Take 1 capsule (100 mg total) by mouth 2 (two) times daily for 5 days., Disp: 10 capsule, Rfl: 0    naproxen  (NAPROSYN ) 500 MG tablet, Take 1 tablet (500 mg total) by mouth 2 (two) times daily., Disp: 20 tablet, Rfl: 0   Norgestimate-Ethinyl Estradiol Triphasic (TRI-SPRINTEC) 0.18/0.215/0.25 MG-35 MCG tablet, Take 1 tablet by mouth daily., Disp: 84 tablet, Rfl: 3   acyclovir  (ZOVIRAX ) 800 MG tablet, Take 1 tablet (800 mg total) by mouth daily., Disp: 30 tablet, Rfl: 12  No Known Allergies   ROS  As noted in HPI.   Physical Exam  BP 117/78 (BP Location: Right Arm)   Pulse 60   Temp 98.5 F (36.9 C) (Oral)   Resp 19   LMP 01/15/2023 (Exact Date)   SpO2 99%   Constitutional: Well developed, well nourished, no acute distress Eyes:  EOMI, conjunctiva normal bilaterally HENT: Normocephalic, atraumatic,mucus membranes moist Respiratory: Normal inspiratory effort Cardiovascular: Normal rate GI: nondistended skin: 4 x 4 cm tender area of erythema, induration with central large pustule on her right proximal lower extremity.  Musculoskeletal: no deformities Neurologic: Alert & oriented x 3, no focal neuro deficits Psychiatric: Speech and behavior appropriate   ED Course   Medications - No data to display  Orders Placed This Encounter  Procedures   Aerobic Culture w Gram Stain (superficial specimen)    Standing Status:   Standing    Number of Occurrences:   1    No results found  for this or any previous visit (from the past 24 hours). No results found.  ED Clinical Impression  1. Cellulitis of right lower extremity   2. Abscess     ED Assessment/Plan    Procedure note: After obtaining verbal consent, cleaned the area extensively with alcohol and chlorhexidine .  Using this sterile 18-gauge needle , made two stab incisions.  Expressed blood.  No pus.  Sent material off for culture.  Cleaned area again.  Placed Band-Aid.  Patient tolerated procedure well.  Patient presents with right lower extremity cellulitis.  She appeared to have a superficial abscess which was  I&D'd today.  Home with doxycycline , Keflex  for 5 days, Naprosyn /Tylenol.  Follow-up here if getting worse.  Will provide primary care list for routine care.  Discussed medical decision making, treatment plan plan for follow-up with patient.  She agrees with plan.   Meds ordered this encounter  Medications   cephALEXin  (KEFLEX ) 500 MG capsule    Sig: Take 2 capsules (1,000 mg total) by mouth 2 (two) times daily for 5 days.    Dispense:  20 capsule    Refill:  0   doxycycline  (VIBRAMYCIN ) 100 MG capsule    Sig: Take 1 capsule (100 mg total) by mouth 2 (two) times daily for 5 days.    Dispense:  10 capsule    Refill:  0   naproxen  (NAPROSYN ) 500 MG tablet    Sig: Take 1 tablet (500 mg total) by mouth 2 (two) times daily.    Dispense:  20 tablet    Refill:  0   chlorhexidine  (HIBICLENS ) 4 % external liquid    Sig: Apply topically daily as needed.    Dispense:  120 mL    Refill:  0    *This clinic note was created using Scientist, clinical (histocompatibility and immunogenetics). Therefore, there may be occasional mistakes despite careful proofreading.  ?     Van Knee, MD 01/31/23 1553

## 2023-01-29 NOTE — ED Triage Notes (Signed)
Patient states that she noticed a bump on her right leg 3 days ago. Tried expressing it. Abscess is right below knee.

## 2023-01-29 NOTE — Discharge Instructions (Addendum)
 Keep this clean with antibacterial soap and water such as Hibiclens .  Continue warm compresses, elevation.  Finish the doxycycline  and Keflex , even if you feel better.  Take the Naprosyn  with 1000 mg of Tylenol twice a day.  Here is a list of primary care providers who are taking new patients:  [facility] [place] Dr. Selinda Ku (sports medicine) Dr. Cathryne Molt [address] [place] [address] [phone]  [facility] at [facility] [address] [place], [place] [address] [phone]  [facility] [place] [address]  [place] [address]  [phone]  [facility] [address] [place], [place] [address] [phone]  [facility] [address] [place]  [phone] [place], [place] [address]  Here are clinics/ other resources who will see you if you do not have insurance. Some have certain criteria that you must meet. Call them and find out what they are:  [facility]: [address] [place], [place] [address] Phone: [phone] Hours: First and Third Saturdays of each Month, 9 a.m. - 1 p.m.  [facility]: [address], [address] [place], [place] [address] Phone: [phone] Hours: Tuesday, 4 p.m. - 8 p.m. Thursday, 1 p.m. - 8 p.m. Wednesday, 9 a.m. - [facility] [address], [place], [place] [address] Phone: [phone] Pharmacy Phone Number: [phone] Dental Phone Number: [phone] [facility] Insurance Help: [phone]  Dental Hours: Monday - Thursday, 8 a.m. - 6 p.m.  [facility] [address] [place], [place] [address] Phone: [phone] Pharmacy Phone Number: [phone] [facility] Insurance Help: [phone]  [facility] [address] [place] [place], [place] [address] Phone: [phone] Pharmacy Phone Number: [phone] [facility] Insurance Help: [phone]  [facility] [address] [place], [place]  [address] Phone: [phone] [facility] Insurance Help: [phone]   [facility] [address], [place], [place] [address] Phone: [phone]  Go to www.goodrx.com  or www.costplusdrugs.com to look up your medications. This will give you a list of where you can find your prescriptions at the most affordable prices. Or ask the pharmacist what the cash price is, or if they have any other discount programs available to help make your medication more affordable. This can be less expensive than what you would pay with insurance.

## 2023-02-02 LAB — AEROBIC CULTURE W GRAM STAIN (SUPERFICIAL SPECIMEN): Gram Stain: NONE SEEN

## 2023-10-23 ENCOUNTER — Other Ambulatory Visit: Payer: Self-pay | Admitting: Nurse Practitioner

## 2023-10-23 DIAGNOSIS — Z3009 Encounter for other general counseling and advice on contraception: Secondary | ICD-10-CM

## 2023-10-28 ENCOUNTER — Telehealth: Payer: Self-pay | Admitting: Family Medicine

## 2023-10-30 ENCOUNTER — Telehealth: Payer: Self-pay | Admitting: Family Medicine

## 2023-10-30 ENCOUNTER — Other Ambulatory Visit: Payer: Self-pay | Admitting: Family Medicine

## 2023-10-30 ENCOUNTER — Other Ambulatory Visit: Payer: Self-pay | Admitting: Nurse Practitioner

## 2023-10-30 DIAGNOSIS — Z3009 Encounter for other general counseling and advice on contraception: Secondary | ICD-10-CM

## 2023-10-30 DIAGNOSIS — B009 Herpesviral infection, unspecified: Secondary | ICD-10-CM

## 2023-10-30 MED ORDER — NORGESTIM-ETH ESTRAD TRIPHASIC 0.18/0.215/0.25 MG-35 MCG PO TABS: 1.0000 | ORAL_TABLET | Freq: Every day | ORAL | 0 refills | Status: DC

## 2023-10-30 NOTE — Telephone Encounter (Signed)
 Patient states that she is currently at [facility] in [place], and was told that nothing has been processed. She called on Monday requesting a refill for her birth control before October 15 and received a message that a month refill was sent to pharmacy.

## 2023-10-30 NOTE — Progress Notes (Signed)
 Sent prescription for birth control to pharmacy as requested.   Has physical exam on 10/15.   Dorothyann Helling, MD 10/30/23  10:47 AM

## 2023-11-13 ENCOUNTER — Ambulatory Visit

## 2023-11-13 VITALS — BP 122/83 | HR 62 | Ht 64.0 in | Wt 197.4 lb

## 2023-11-13 DIAGNOSIS — Z3009 Encounter for other general counseling and advice on contraception: Secondary | ICD-10-CM | POA: Diagnosis not present

## 2023-11-13 DIAGNOSIS — N898 Other specified noninflammatory disorders of vagina: Secondary | ICD-10-CM

## 2023-11-13 LAB — WET PREP FOR TRICH, YEAST, CLUE
Clue Cell Exam: NEGATIVE
Trichomonas Exam: NEGATIVE
Yeast Exam: NEGATIVE

## 2023-11-13 MED ORDER — NORGESTIM-ETH ESTRAD TRIPHASIC 0.18/0.215/0.25 MG-35 MCG PO TABS
1.0000 | ORAL_TABLET | Freq: Every day | ORAL | 13 refills | Status: AC
Start: 2023-11-13 — End: ?

## 2023-11-13 NOTE — Progress Notes (Signed)
 Pt is here for Annual visit. Wet prep results reviewed with patient and required no treatment per SO. Condoms Declined. Kwadwo Nayeliz Hipp,RN.

## 2023-11-13 NOTE — Progress Notes (Signed)
 [employer] HEALTH DEPARTMENT [facility] [address] [place] [address] Main phone: [phone]  Family Planning Visit - Repeat Yearly Visit  Subjective:  Sonya Perez is a 33 y.o. G1P1001  being seen today for an annual wellness visit and to discuss contraception options. The patient is currently using oral contraceptive for pregnancy prevention. Patient does not want a pregnancy in the next year.   Patient reports they are looking to continue her birth control pills.  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 Content with her birth control, no bothersome side effects and no missed pills.  Does complain of some intermittent discharge that is not irritating, no itching or burning. She is unsure if this discharge is normal or not. Discussed vaginal hygiene basics including gentle, unscented soaps on external genital area only.  No breast concerns.  Patient denies other concerns.   Review of Systems  Neurological:  Positive for headaches.  All other systems reviewed and are negative.  See flowsheet for further details and programmatic requirements Hyperlink available at the top of the signed note in blue.  Flow sheet content below:  Pregnancy Intention Screening Does the patient want to become pregnant in the next year?: No Does the patient's partner want to become pregnant in the next year?: No Would the patient like to discuss contraceptive options today?: Yes Sexual History What age did you start your period?: 13 How often do you  have your period?: no periods Date of last sex?: 11/12/23 Has the patient had unprotected sex within the last 5 days?: No Do you have sex with men, women, both men and women?: Men only In the past 2 months how many partners have you had sex with?: 1 In the past 12 months, how many partners have you had sex with?: 1 Is it possible that any of your sex partners in the past 12 months had sex with someone else whild they were still in a sexual relationship with you?: No What ways do you have sex?: Vaginal, Oral Do you or your partner use condoms and/or dental dams every time you have vaginal, oral or anal sex?: Sometimes Do you douche?: No Date of last HIV test?: 05/22/22 Have you ever had an STD?: Yes Have any of your partners had an STD?: No Have you or your partner ever shot up drugs?: No Have any of your partners used drugs in the past?: No Have you or your partners exchanged money or drugs for sex?: Yes Contraception Wrap Up Current Method: Oral Contraceptive End Method: Oral Contraceptive Contraception Counseling Provided: Yes How was the end contraceptive method provided?: Provided on site  Diabetes screening This patient is 33 y.o. with a BMI of Body mass index is 33.88 kg/m.Sonya Perez  Is patient eligible for diabetes screening (age >35 and BMI >25)?  no  Was Hgb A1c ordered? no  STI screening Patient reports 1 of partners in last year.  Does this patient desire STI screening?  No - declines Declines all blood tests.  Cervical Cancer Screening  Repeat 2027  Result Date Procedure Results Follow-ups  11/08/2022 IGP, Aptima HPV DIAGNOSIS:: Comment Specimen adequacy:: Comment Clinician Provided ICD10: Comment Performed by:: Comment PAP Smear Comment: . Note:: Comment Test Methodology: CANCELED HPV Aptima: Negative   05/23/2021 IGP, Aptima HPV DIAGNOSIS:: Comment Specimen adequacy:: Comment Clinician Provided ICD10: Comment Performed by:: Comment Electronically signed by::  Comment PAP Smear Comment: . Note:: Comment Test Methodology: Comment HPV Aptima: Negative   05/30/2020 IGP, Aptima HPV DIAGNOSIS:: Comment (A) Specimen adequacy:: Comment Clinician Provided ICD10: Comment Performed by:: Comment Electronically signed by:: Comment PAP Smear Comment: . PATHOLOGIST PROVIDED ICD10:: Comment Note:: Comment Test Methodology: Comment HPV Aptima: Positive (A)   05/28/2019 Surgical pathology SURGICAL PATHOLOGY: SURGICAL PATHOLOGY CASE: [id number] PATIENT: Sonya Perez Surgical Pathology Report     Clinical History: LGSIL pap (cm)     FINAL MICROSCOPIC DIAGNOSIS:  A. CERVIX, 6 O'CLOCK, BIOPSY: - Low-grade squamous intraepithelial lesion, CIN-1...   05/05/2019 IGP, rfx Aptima HPV ASCU DIAGNOSIS:: Comment (A) Specimen adequacy:: Comment Clinician Provided ICD10: Comment Performed by:: Comment Electronically signed by:: Comment PAP Smear Comment: . PATHOLOGIST PROVIDED ICD10:: Comment Note:: Comment Test Methodology: Comment PAP Reflex: Comment   04/26/2016 HM PAP SMEAR HM Pap smear: Negative    Health Maintenance Due  Topic Date Due   Hepatitis C Screening  Never done   Hepatitis B Vaccines 19-59 Average Risk (1 of 3 - 19+ 3-dose series) Never done   HPV VACCINES (1 - 3-dose SCDM series) Never done   Influenza Vaccine  Never done   COVID-19 Vaccine (1 - 2025-26 season) Never done   Cervical Cancer Screening (Pap smear)  11/08/2023   The following portions of the patient's history were reviewed and updated as appropriate: allergies, current medications, past family history, past medical history, past social history, past surgical history and problem list. Problem list updated.  Objective:   Vitals:   11/13/23 0829  BP: 122/83  Pulse: 62  Weight: 197 lb 6.4 oz (89.5 kg)  Height: 5' 4 (1.626 m)   Physical Exam Vitals and nursing note reviewed.  Constitutional:      Appearance: Normal appearance.  HENT:     Head: Normocephalic.      Mouth/Throat:     Mouth: Mucous membranes are moist.  Cardiovascular:     Rate and Rhythm: Normal rate.     Heart sounds: Normal heart sounds, S1 normal and S2 normal.  Pulmonary:     Effort: Pulmonary effort is normal.     Breath sounds: Normal breath sounds.  Abdominal:     Palpations: Abdomen is soft.  Genitourinary:    Comments: Declined genital exam- self swabbed Musculoskeletal:        General: Normal range of motion.  Lymphadenopathy:     Head:     Right side of head: No submandibular, preauricular or posterior auricular adenopathy.     Left side of head: No submandibular, preauricular or posterior auricular adenopathy.     Cervical: No cervical adenopathy.     Upper Body:     Right upper body: No supraclavicular or axillary adenopathy.     Left upper body: No supraclavicular or axillary adenopathy.  Skin:    General: Skin is warm and dry.  Neurological:     Mental Status: She is alert and oriented to person, place, and time.  Psychiatric:        Mood and Affect: Mood normal.    Assessment and Plan:  Sonya Perez is a 33 y.o. female G1P1001 presenting to the  [facility] Department for an yearly wellness and contraception visit  1. Family planning  - Norgestimate-Ethinyl Estradiol Triphasic (TRI-ESTARYLLA) 0.18/0.215/0.25 MG-35 MCG tablet; Take 1 tablet by mouth daily.  Dispense: 28 tablet; Refill: 13 - No contraindicatons for estrogen - she does not smoke, no hypertension, no DVT/PE, no breast cancer. She does have some migraines associated with computer use, no aura.   Contraception counseling:  Reviewed options based on patient desire and reproductive life plan. Patient is interested in Oral Contraceptive. This was provided to the patient today.   Risks, benefits, and typical effectiveness rates were reviewed.  Questions were answered.  Written information was also given to the patient to review.    The patient will follow up in  1 years for  surveillance.  The patient was told to call with any further questions, or with any concerns about this method of contraception.  Emphasized use of condoms 100% of the time for STI prevention.  Emergency Contraception Precautions (ECP): Patient assessed for need of ECP. She is not a candidate based on OCPs used correctly without missed doses.   2. Vaginal discharge (Primary)  - WET PREP FOR TRICH, YEAST, CLUE   Return in about 1 year (around 11/12/2024).  No future appointments.  Damien FORBES Satchel, NP
